# Patient Record
Sex: Female | Born: 1980 | Race: White | Hispanic: No | Marital: Married | Smoking: Former smoker
Health system: Southern US, Community
[De-identification: ages and names within clinical notes are randomized; demographics above are authoritative.]

## PROBLEM LIST (undated history)

## (undated) DIAGNOSIS — R87629 Unspecified abnormal cytological findings in specimens from vagina: Secondary | ICD-10-CM

## (undated) DIAGNOSIS — IMO0002 Reserved for concepts with insufficient information to code with codable children: Secondary | ICD-10-CM

## (undated) DIAGNOSIS — D069 Carcinoma in situ of cervix, unspecified: Secondary | ICD-10-CM

## (undated) DIAGNOSIS — G40909 Epilepsy, unspecified, not intractable, without status epilepticus: Secondary | ICD-10-CM

## (undated) DIAGNOSIS — F419 Anxiety disorder, unspecified: Secondary | ICD-10-CM

## (undated) DIAGNOSIS — R569 Unspecified convulsions: Secondary | ICD-10-CM

## (undated) HISTORY — DX: Unspecified abnormal cytological findings in specimens from vagina: R87.629

## (undated) HISTORY — DX: Reserved for concepts with insufficient information to code with codable children: IMO0002

## (undated) HISTORY — DX: Epilepsy, unspecified, not intractable, without status epilepticus: G40.909

## (undated) HISTORY — PX: CERVICAL CONE BIOPSY: SUR198

## (undated) HISTORY — DX: Anxiety disorder, unspecified: F41.9

## (undated) HISTORY — DX: Carcinoma in situ of cervix, unspecified: D06.9

---

## 1999-06-08 ENCOUNTER — Emergency Department (HOSPITAL_COMMUNITY): Admission: EM | Admit: 1999-06-08 | Discharge: 1999-06-08 | Payer: Self-pay | Admitting: Emergency Medicine

## 2001-11-28 ENCOUNTER — Other Ambulatory Visit: Admission: RE | Admit: 2001-11-28 | Discharge: 2001-11-28 | Payer: Self-pay | Admitting: Family Medicine

## 2005-08-25 ENCOUNTER — Other Ambulatory Visit: Admission: RE | Admit: 2005-08-25 | Discharge: 2005-08-25 | Payer: Self-pay | Admitting: Obstetrics and Gynecology

## 2005-10-23 ENCOUNTER — Encounter (INDEPENDENT_AMBULATORY_CARE_PROVIDER_SITE_OTHER): Payer: Self-pay | Admitting: *Deleted

## 2005-10-24 ENCOUNTER — Ambulatory Visit (HOSPITAL_COMMUNITY): Admission: RE | Admit: 2005-10-24 | Discharge: 2005-10-24 | Payer: Self-pay | Admitting: Obstetrics and Gynecology

## 2007-12-07 ENCOUNTER — Ambulatory Visit (HOSPITAL_COMMUNITY): Admission: RE | Admit: 2007-12-07 | Discharge: 2007-12-07 | Payer: Self-pay | Admitting: Obstetrics and Gynecology

## 2008-02-03 ENCOUNTER — Ambulatory Visit (HOSPITAL_COMMUNITY): Admission: RE | Admit: 2008-02-03 | Discharge: 2008-02-03 | Payer: Self-pay | Admitting: Family Medicine

## 2008-02-09 ENCOUNTER — Encounter: Admission: RE | Admit: 2008-02-09 | Discharge: 2008-02-09 | Payer: Self-pay | Admitting: Family Medicine

## 2008-03-04 ENCOUNTER — Emergency Department (HOSPITAL_COMMUNITY): Admission: EM | Admit: 2008-03-04 | Discharge: 2008-03-04 | Payer: Self-pay | Admitting: Emergency Medicine

## 2008-04-05 ENCOUNTER — Encounter: Admission: RE | Admit: 2008-04-05 | Discharge: 2008-04-05 | Payer: Self-pay | Admitting: Neurology

## 2008-09-16 ENCOUNTER — Ambulatory Visit: Payer: Self-pay | Admitting: Obstetrics and Gynecology

## 2008-09-16 ENCOUNTER — Encounter: Payer: Self-pay | Admitting: Obstetrics and Gynecology

## 2008-09-16 ENCOUNTER — Other Ambulatory Visit: Admission: RE | Admit: 2008-09-16 | Discharge: 2008-09-16 | Payer: Self-pay | Admitting: Obstetrics and Gynecology

## 2008-09-22 ENCOUNTER — Ambulatory Visit: Payer: Self-pay | Admitting: Obstetrics and Gynecology

## 2009-10-02 ENCOUNTER — Other Ambulatory Visit: Admission: RE | Admit: 2009-10-02 | Discharge: 2009-10-02 | Payer: Self-pay | Admitting: Obstetrics and Gynecology

## 2009-10-02 ENCOUNTER — Ambulatory Visit: Payer: Self-pay | Admitting: Obstetrics and Gynecology

## 2010-05-24 ENCOUNTER — Ambulatory Visit: Payer: Self-pay | Admitting: Obstetrics and Gynecology

## 2010-06-14 ENCOUNTER — Other Ambulatory Visit: Payer: Self-pay | Admitting: Obstetrics and Gynecology

## 2010-06-14 ENCOUNTER — Other Ambulatory Visit (HOSPITAL_COMMUNITY)
Admission: RE | Admit: 2010-06-14 | Discharge: 2010-06-14 | Disposition: A | Discharge status: Home / Self Care | Payer: BC Managed Care – PPO | Source: Ambulatory Visit | Attending: Obstetrics and Gynecology | Admitting: Obstetrics and Gynecology

## 2010-06-14 ENCOUNTER — Ambulatory Visit (INDEPENDENT_AMBULATORY_CARE_PROVIDER_SITE_OTHER): Payer: BC Managed Care – PPO | Admitting: Obstetrics and Gynecology

## 2010-06-14 DIAGNOSIS — D069 Carcinoma in situ of cervix, unspecified: Secondary | ICD-10-CM

## 2010-06-14 DIAGNOSIS — R87619 Unspecified abnormal cytological findings in specimens from cervix uteri: Secondary | ICD-10-CM | POA: Insufficient documentation

## 2010-06-22 NOTE — Op Note (Signed)
Shirley Stephenson, Shirley Stephenson NO.:  [id number]   MEDICAL RECORD NO.:  [id number]          PATIENT TYPE:  AMB   LOCATION:  SDC                           FACILITY:  [facility]   PHYSICIAN:  Janine Limbo, M.D.DATE OF BIRTH:  23-Jan-1981   DATE OF PROCEDURE:  12/07/2007  DATE OF DISCHARGE:                               OPERATIVE REPORT   PREOPERATIVE DIAGNOSES:  1. Cervical intraepithelial neoplasia II.  2. Human papillomavirus.   POSTOPERATIVE DIAGNOSIS:  1. Cervical intraepithelial neoplasia II.  2. Human papillomavirus.   PROCEDURE:  Laser ablation of the cervix.   SURGEON:  Janine Limbo, MD   FIRST ASSISTANT:  None.   ANESTHETIC:  General.   DISPOSITION:  Shirley Stephenson is a 30 year old female, para 1-0- 2-1, who  presents with CIN II.  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  She understands the indications for her surgical  procedure as well as the alternative treatment options.  She accepts the  risks of, but not limited to, anesthetic complications, bleeding,  infections, and possible damage to the surrounding organs.  The patient  wishes to maintain her childbearing potential and is concerned about  cervical weakening if she proceeds with a repeat conization of the  cervix.   FINDINGS:  The patient was noted to have white epithelium to the left  and superior of the cervical os.  There was also an area of white  epithelium at the 6 o'clock position at the transition zone.  The  transition zone was completely seen.  The patient tolerated her  procedure well.   PROCEDURE:  The patient was taken to the operating room where a general  anesthetic was given.  The patient's perineum and vagina were prepped  with multiple layers of Betadine.  The bladder was drained of urine.  Examination under anesthesia was performed.  The patient was then draped  in sterile  wet towels.  The laser was appropriately tested and was noted  to be functioning correctly.  Colposcopy was performed.  The patient's  cervix was coated with acetic acid.  White epithelial lesions were noted  as mentioned above.  We then used a focused beam of the laser to outline  margins for ablation.  We used a defocused beam of the laser set at 20  watts to ablate the cervix removing all white epithelial lesions.  A 0.5  cm border was then ablated using a defocused beam of the laser.  The  endocervix was then sounded using a Betadine-soaked uterine sound.  The  endocervix was noted to be patent.  Hemostasis was adequate.  We were  ready to terminate our procedure.  Sponge, needle, and instrument counts  were correct.  Estimated blood loss was zero.  The patient tolerated her  procedure well.  She was awakened from her anesthetic without difficulty  and then taken to the recovery room in stable condition.   FOLLOWUP INSTRUCTIONS:  The patient was given a prescription for Motrin  and she  will take 800 mg every 8 hours as needed for mild-to-moderate  pain.  She was given prescription for Vicodin and she will take 1 or 2  tablets every 4 hours as needed for severe pain.  She was given a  prescription for Zofran and she will take 4 mg every 4-6 hours as needed  for nausea.  She was given a copy of the postoperative instruction sheet  as prepared by the [facility] for patients who have  undergone a dilatation and curettage with them modified for laser  procedure.  She will return to see Dr. Stefano Gaul in 2-3 weeks for  followup examination.  She will refrain from intercourse until all  discharge and bleeding has resolved.      Janine Limbo, M.D.  Electronically Signed     AVS/MEDQ  D:  12/07/2007  T:  12/07/2007  Job:  [id number]   cc:   Talmadge Coventry, M.D.  Fax: [phone]

## 2010-06-22 NOTE — H&P (Signed)
Shirley Stephenson, Shirley Stephenson NO.:  [id number]   MEDICAL RECORD NO.:  [id number]          PATIENT TYPE:  AMB   LOCATION:  SDC                           [facility]  [facility]   PHYSICIAN:  Janine Limbo, M.D.DATE OF BIRTH:  02-Jun-1980   DATE OF ADMISSION:  DATE OF DISCHARGE:                              HISTORY & PHYSICAL   HISTORY OF PRESENT ILLNESS:  Shirley Stephenson is a 30 year old female, para 1-  0-2-1, who presents for laser ablation of the cervix.  The patient has  been followed at the [facility]  [facility].  The patient had a cold knife  conization in 2007 because of cervical intraepithelial neoplasia III.  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  Her Pap  smear in September 2009 showed a high-grade lesion.  Colposcopically  directed biopsies were performed and the patient was found to have  cervical intraepithelial neoplasia II on the exocervix.  The  endocervical curettage was negative.   OBSTETRICAL HISTORY:  The patient has had 1 term vaginal delivery and 2  elective pregnancy terminations.   DRUG ALLERGIES:  NO KNOWN DRUG ALLERGIES.   PAST MEDICAL HISTORY:  1. The patient has a history of postpartum depression.  2. She also has a history of anxiety, chronic depression, and      anorexia.  3. She has had her wisdom teeth removed.  4. She had a cold knife conization of the cervix in 2007.   SOCIAL HISTORY:  The patient smokes cigarettes socially.  She drinks  alcohol socially.  She denies other recreational drug uses.   REVIEW OF SYSTEMS:  Noncontributory.   FAMILY HISTORY:  Noncontributory.   PHYSICAL EXAMINATION:  VITAL SIGNS:  Weight is 154 pounds.  HEENT:  Within normal limits.  CHEST:  Clear.  HEART:  Regular rate and rhythm.  BREASTS:  Without masses.  ABDOMEN:  Nontender.  EXTREMITIES:  Grossly normal.  NEUROLOGIC:  Grossly normal.  PELVIC:  External genitalia  is normal.  Vagina is normal.  Cervix is  nontender.  Uterus is normal size, shape and consistency.  Adnexa, no  masses.   ASSESSMENT:  Recurrent cervical intraepithelial neoplasia II.   PLAN:  A long discussion was held with the patient and her significant  other about her treatment options.  The patient wishes to maintain her  childbearing potential at this time.  She is concerned about the risk of  preterm labor  and/or cervical weakening associated with a repeat conization of the  cervix.  She has elected to proceed with laser ablation of the cervix.  She understands the indications for her surgical procedure.  She accepts  the risks of, but not limited to, anesthetic complications, bleeding,  infections, and possible damage to the surrounding organs.      Janine Limbo, M.D.  Electronically Signed     AVS/MEDQ  D:  12/05/2007  T:  12/05/2007  Job:  [id number]   cc:   Talmadge Coventry, M.D.  Fax: [phone]

## 2010-06-25 NOTE — Op Note (Signed)
Shirley Stephenson, Shirley Stephenson NO.:  [id number]   MEDICAL RECORD NO.:  [id number]          PATIENT TYPE:  AMB   LOCATION:  SDC                           [facility]   PHYSICIAN:  Janine Limbo, M.D.DATE OF BIRTH:  July 30, 1980   DATE OF PROCEDURE:  10/24/2005  DATE OF DISCHARGE:                                 OPERATIVE REPORT   PREOPERATIVE DIAGNOSIS:  Cervical intraepithelial neoplasia III.   POSTOPERATIVE DIAGNOSIS:  Cervical intraepithelial neoplasia III.   PROCEDURE:  Cold knife conization.   SURGEON:  Leonard Schwartz, M.D.   ANESTHETIC:  General.   DISPOSITION:  Ms. Mcnelly is a 30 year old female, para 1-0-2-1, who  presents with the above-mentioned diagnosis.  She understands the  indications for her surgical procedure and she accepts the risks of, but not  limited to, anesthetic complications, bleeding, infections, and possible  damage to the surrounding organs.  She understands that there is a small  increased risk of preterm labor in future pregnancies.   FINDINGS:  There was a non-staining area at the 12 o'clock position on the  cervix.  No other abnormalities were noted.   PROCEDURE:  The patient was taken to the operating room, where a general  anesthetic was given.  The patient's abdomen, perineum, and vagina were  prepped with multiple layers of Betadine.  The bladder was drained of urine.  The patient was sterilely draped.  Examination under anesthesia was  performed.  A paracervical block was placed using 10 mL of 0.5% Marcaine  with epinephrine.  An additional 15 mL of 0.5% Marcaine with epinephrine  were injected directly into the cervix.  The cervix was coated we had  Lugol's solution.  A non-staining area was noted at the 12 o'clock position.  The endocervix was sounded.  A circumferential incision was made around the  cervix, removing all of the non-staining areas.  The incision was then  extended in a cone-like fashion toward  the endocervix.  The specimen was  removed.  The specimen had already been opened at the 2 o'clock position.  The specimen was sent to pathology for evaluation.  The Bovie cautery was  used to cauterize the defect in the cervix and also to cauterize the  periphery of the conization specimen.  Stitches had previously been placed  at the 3 o'clock position and the 9 o'clock position on the cervix.  We then  placed 4 inverting sutures into the cervix.  Hemostasis was adequate.  All  instruments were removed.  The examination was repeated and the uterus was  noted to be firm.  The patient was then returned to the supine position.  The anesthetic was reversed.  The patient was taken to the recovery room in  stable condition.  Sponge, needle, and instrument counts were correct on 2  occasions.  The estimated blood loss for the procedure was 10 mL.  The  patient tolerated her procedure well.  A 0 Vicryl was the suture material  used throughout the procedure, except where otherwise mentioned.   FOLLOWUP INSTRUCTIONS:  The patient will return to see Dr.  Stringer in 2-3  weeks for a followup examination.  She was given a copy of the postoperative  instruction sheet as prepared by the [facility]  [facility] for patients who have undergone a dilatation and curettage, but  then was modified for a conization of the cervix.  The patient will take  ibuprofen 800 mg every 8 hours as needed for mild to moderate pain.  She  will take Vicodin one or two tablets every 4 hours as needed for severe  pain.  She will call for questions or concerns.      Janine Limbo, M.D.  Electronically Signed     AVS/MEDQ  D:  10/24/2005  T:  10/25/2005  Job:  [id number]   cc:   Talmadge Coventry, M.D.  Fax: [phone]

## 2010-06-25 NOTE — H&P (Signed)
Shirley Stephenson, Shirley Stephenson NO.:  [id number]   MEDICAL RECORD NO.:  [id number]          PATIENT TYPE:  AMB   LOCATION:  SDC                           [facility]   PHYSICIAN:  Janine Limbo, M.D.DATE OF BIRTH:  Jun 01, 1980   DATE OF ADMISSION:  10/27/2005  DATE OF DISCHARGE:                                HISTORY & PHYSICAL   HISTORY OF PRESENT ILLNESS:  Ms. Carico is a 30 year old female, para 1-0-2-  1, who presents for a cold knife conization of the cervix.  The patient had  colposcopically directed biopsies that showed a high grade squamous  intraepithelial lesion and cells consistent with cervical intraepithelial  neoplasia III.  She also has a history of the human papilloma virus.   OBSTETRICAL HISTORY:  The patient has had one term vaginal delivery and two  elective pregnancy terminations.   ALLERGIES:  No known drug allergies.   PAST MEDICAL HISTORY:  The patient has a history of postpartum depression.  She also has a history of anxiety, chronic depression, and anorexia.  She  has had her wisdom teeth removed.   SOCIAL HISTORY:  The patient smokes 5-10 cigarettes each day.  She drinks  alcohol socially.  She denies other recreational drug use.   REVIEW OF SYSTEMS:  Noncontributory.   FAMILY HISTORY:  Noncontributory.   PHYSICAL EXAMINATION:  VITAL SIGNS:  Weight 141 pounds.  HEENT:  Within normal limits.  CHEST:  Clear.  HEART:  Regular rate and rhythm.  BREASTS:  Without masses.  ABDOMEN:  Nontender.  EXTREMITIES:  Grossly normal.  NEUROLOGICAL:  Grossly normal.  PELVIC EXAM:  External genitalia shows a few hyperpigmented lesions.  Vagina  is within normal limits.  The cervix is  nontender.  The uterus is normal  size, shape and consistency.  Adnexa no masses.   ASSESSMENT:  Cervical intraepithelial neoplasia III.   PLAN:  The patient will undergo a cold knife conization of the cervix.  She  understands the indications for her surgical  procedure and she accepts the  risks of, but not limited to, anesthetic complications, bleeding, infection,  and possible damage to surrounding organs.      Janine Limbo, M.D.  Electronically Signed     AVS/MEDQ  D:  10/21/2005  T:  10/21/2005  Job:  [id number]   cc:   Talmadge Coventry, M.D.  Fax: [phone]

## 2010-09-10 ENCOUNTER — Encounter: Payer: Self-pay | Admitting: Obstetrics and Gynecology

## 2010-09-27 ENCOUNTER — Encounter: Payer: Self-pay | Admitting: Gynecology

## 2010-09-27 ENCOUNTER — Telehealth: Payer: Self-pay | Admitting: *Deleted

## 2010-09-27 ENCOUNTER — Ambulatory Visit (INDEPENDENT_AMBULATORY_CARE_PROVIDER_SITE_OTHER): Payer: BC Managed Care – PPO | Admitting: Gynecology

## 2010-09-27 DIAGNOSIS — N63 Unspecified lump in unspecified breast: Secondary | ICD-10-CM

## 2010-09-27 NOTE — Telephone Encounter (Signed)
[facility] CALLED STATING THAT THE PATIENT NEEDS TO HAVE BILATERAL MAMMO. ALSO WITH THE ULTRASOUND YOU ORDERED . PLEASE ADVISE.

## 2010-09-27 NOTE — Telephone Encounter (Signed)
PT APPOINTMENT SET FOR 10/04/10 AT 1:50PM AT [facility].

## 2010-09-27 NOTE — Progress Notes (Signed)
Patient presents complaining of right breast mass for approximately one month. She never noticed this before. It's nontender hasn't changed since she first noticed it. She has no strong family history of breast cancer.  Exam Breasts: Examined lying and sitting Left without masses retractions discharge adenopathy Right palpable firm mobile mass approximately 1.5 cm 12:00 position off the breast anterior chest wall nontender no overlying skin changes. Breast tissue itself is without masses retractions discharge or adenopathy.  Assessment and plan: Right chest wall mass actually feels above her breast tissue proper. We'll start with ultrasound scheduled at the [facility] Center and go from there. Differential was discussed with the patient to include sebaceous cyst, fibroadenoma, simple cyst and tumor.  Various scenarios reviewed to include if benign on ultrasound monitor and recheck in 6 months assuming stable, referral to general surgery regardless for excision and possible needle sampling.  Patient knows we will schedule the appointment if she does not hear from my office within several days to call me to make sure that we do so.

## 2010-09-27 NOTE — Telephone Encounter (Signed)
ok 

## 2010-10-03 ENCOUNTER — Other Ambulatory Visit: Payer: Self-pay | Admitting: Obstetrics and Gynecology

## 2010-10-04 ENCOUNTER — Ambulatory Visit
Admission: RE | Admit: 2010-10-04 | Discharge: 2010-10-04 | Disposition: A | Discharge status: Home / Self Care | Payer: BC Managed Care – PPO | Source: Ambulatory Visit | Attending: Gynecology | Admitting: Gynecology

## 2010-10-04 DIAGNOSIS — N63 Unspecified lump in unspecified breast: Secondary | ICD-10-CM

## 2010-10-06 ENCOUNTER — Encounter: Payer: Self-pay | Admitting: Gynecology

## 2010-10-06 ENCOUNTER — Ambulatory Visit (INDEPENDENT_AMBULATORY_CARE_PROVIDER_SITE_OTHER): Payer: BC Managed Care – PPO | Admitting: Gynecology

## 2010-10-06 ENCOUNTER — Ambulatory Visit: Payer: BC Managed Care – PPO | Admitting: Gynecology

## 2010-10-06 DIAGNOSIS — N63 Unspecified lump in unspecified breast: Secondary | ICD-10-CM

## 2010-10-06 NOTE — Progress Notes (Signed)
Patient presents in followup of her right breast mass. She had a mammogram and ultrasound read by Dr. Deboraha Sprang and the impression was probable benign fibroadenoma suggest short interval follow up ultrasound 6, 12 and 24 months. I asked patient to come back in to me reexamine her and then to make sure she is comfortable with this recommendation.  Exam Right breast examined lying and sitting with firm oval mass approximately 1.5 cm 12:00 position at the periphery of the breast. Mobile no overlying skin changes no other masses discharge or axillary adenopathy.  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 I discussed with the patient the issues that diagnosis of probable benign means also possible malignant and she needs to feel comfortable with expectant management particularly if a malignancy is discovered in the future. Options to include needle biopsy as well as excisional biopsy was discussed. Patient's comfortable with observation she understands next sepsis the disclaimer of malignancy and she'll followup for recheck in 6 months per Dr. Teodoro Kil recommendation

## 2010-11-08 LAB — URINALYSIS, ROUTINE W REFLEX MICROSCOPIC
Ketones, ur: 15 — AB
Nitrite: NEGATIVE
Protein, ur: NEGATIVE
Urobilinogen, UA: 0.2
pH: 5

## 2010-11-08 LAB — CBC
Platelets: 280
RDW: 12.3

## 2010-11-08 LAB — HCG, SERUM, QUALITATIVE: Preg, Serum: NEGATIVE

## 2010-11-08 LAB — PREGNANCY, URINE: Preg Test, Ur: NEGATIVE

## 2010-12-22 ENCOUNTER — Other Ambulatory Visit: Payer: Self-pay | Admitting: Obstetrics and Gynecology

## 2011-01-03 ENCOUNTER — Other Ambulatory Visit (HOSPITAL_COMMUNITY)
Admission: RE | Admit: 2011-01-03 | Discharge: 2011-01-03 | Disposition: A | Discharge status: Home / Self Care | Payer: BC Managed Care – PPO | Source: Ambulatory Visit | Attending: Obstetrics and Gynecology | Admitting: Obstetrics and Gynecology

## 2011-01-03 ENCOUNTER — Ambulatory Visit (INDEPENDENT_AMBULATORY_CARE_PROVIDER_SITE_OTHER): Payer: BC Managed Care – PPO | Admitting: Obstetrics and Gynecology

## 2011-01-03 DIAGNOSIS — B373 Candidiasis of vulva and vagina: Secondary | ICD-10-CM

## 2011-01-03 DIAGNOSIS — D069 Carcinoma in situ of cervix, unspecified: Secondary | ICD-10-CM

## 2011-01-03 DIAGNOSIS — N841 Polyp of cervix uteri: Secondary | ICD-10-CM

## 2011-01-03 DIAGNOSIS — Z01419 Encounter for gynecological examination (general) (routine) without abnormal findings: Secondary | ICD-10-CM | POA: Insufficient documentation

## 2011-01-03 DIAGNOSIS — N938 Other specified abnormal uterine and vaginal bleeding: Secondary | ICD-10-CM

## 2011-01-03 DIAGNOSIS — N899 Noninflammatory disorder of vagina, unspecified: Secondary | ICD-10-CM

## 2011-01-03 DIAGNOSIS — N898 Other specified noninflammatory disorders of vagina: Secondary | ICD-10-CM

## 2011-01-03 DIAGNOSIS — N949 Unspecified condition associated with female genital organs and menstrual cycle: Secondary | ICD-10-CM

## 2011-01-03 NOTE — Progress Notes (Signed)
Patient came to see me today for problem visit. Shortly after starting intercourse 2 weeks ago she noticed some vaginal bleeding. They stopped at that point. She has not had intercourse since. She is having no itching or vaginal discharge. She remains on continuous birth control pills such as not have a period. She is very worried that she has a malignancy because of her CIN-3 with a previous conization.  Pelvic exam: Kennon Portela present.  External: Within normal limits BUS: Within normal limits. Vagina: Within normal limits. Cervix: Clean but small growth at external os. Not sure if it's just scarring from conization or small polyp. Uterus: Normal size and shape. Adnexa: Within normal limits.wet prep positive for yeast.  Assessment: Post coital bleeding-one episode. Yeast vaginitis. Possible cervical polyp. CIN-3.  Plan: Pap and endometrial biopsy done. Nurse will call patient. Diflucan 100 mg daily for 4 days. Small growth from the cervix removed. Discussed saline infusion histogram if above persists.

## 2011-01-04 MED ORDER — FLUCONAZOLE 100 MG PO TABS: 100.0000 mg | ORAL_TABLET | Freq: Every day | ORAL | Status: AC

## 2011-01-04 NOTE — Progress Notes (Signed)
Addended by: Venora Maples on: 01/04/2011 04:00 PM   Modules accepted: Orders

## 2011-01-04 NOTE — Progress Notes (Signed)
PT. CALLED BACK  AND STATES HER RX WAS NOT AT [facility] WHEN SHE WENT TO PICK IT UP SO I CALLED GENERIC DIFLUCAN RX TO [facility].

## 2011-03-31 ENCOUNTER — Ambulatory Visit: Payer: BC Managed Care – PPO | Admitting: Internal Medicine

## 2011-06-05 ENCOUNTER — Other Ambulatory Visit: Payer: Self-pay | Admitting: Obstetrics and Gynecology

## 2011-06-11 ENCOUNTER — Other Ambulatory Visit: Payer: Self-pay | Admitting: Obstetrics and Gynecology

## 2011-06-12 ENCOUNTER — Telehealth: Payer: Self-pay | Admitting: Gynecology

## 2011-06-12 MED ORDER — LEVONORGESTREL-ETHINYL ESTRAD 0.1-20 MG-MCG PO TABS: 1.0000 | ORAL_TABLET | Freq: Every day | ORAL | Status: DC

## 2011-06-12 NOTE — Telephone Encounter (Signed)
Needs refill on BCPs.  Orsythia X 6 ordered.

## 2011-09-12 DIAGNOSIS — L658 Other specified nonscarring hair loss: Secondary | ICD-10-CM | POA: Insufficient documentation

## 2011-12-12 ENCOUNTER — Other Ambulatory Visit: Payer: Self-pay | Admitting: Gynecology

## 2012-01-02 ENCOUNTER — Ambulatory Visit (INDEPENDENT_AMBULATORY_CARE_PROVIDER_SITE_OTHER): Payer: 59 | Admitting: Gynecology

## 2012-01-02 ENCOUNTER — Encounter: Payer: Self-pay | Admitting: Gynecology

## 2012-01-02 DIAGNOSIS — N898 Other specified noninflammatory disorders of vagina: Secondary | ICD-10-CM

## 2012-01-02 DIAGNOSIS — N9089 Other specified noninflammatory disorders of vulva and perineum: Secondary | ICD-10-CM

## 2012-01-02 DIAGNOSIS — N889 Noninflammatory disorder of cervix uteri, unspecified: Secondary | ICD-10-CM

## 2012-01-02 DIAGNOSIS — N930 Postcoital and contact bleeding: Secondary | ICD-10-CM

## 2012-01-02 LAB — WET PREP FOR TRICH, YEAST, CLUE: Trich, Wet Prep: NONE SEEN

## 2012-01-02 MED ORDER — FLUCONAZOLE 200 MG PO TABS: 200.0000 mg | ORAL_TABLET | Freq: Every day | ORAL | Status: DC

## 2012-01-02 MED ORDER — METRONIDAZOLE 500 MG PO TABS: 500.0000 mg | ORAL_TABLET | Freq: Two times a day (BID) | ORAL | Status: DC

## 2012-01-02 NOTE — Progress Notes (Signed)
Patient presents with several issues.  The first is postcoital spotting or bleeding. She notes with almost every coital episode some bleeding anywhere from light spotting to a menstrual type flow. She is on oral contraceptives initially taking continuously but now intermittently with monthly withdrawals. No pain or other symptoms. Also having vulvar "skin tags" that she wants me to look at. Apparently these had been biopsied previously by Dr. Stefano Gaul. She does have a history of significant cervical disc plasia with cone biopsy x2 in the 2006-2008 timeframe.  Her Pap smears have been normal since then. She's also being followed for epilepsy.  Exam with Fleet Contras assistant External with multiple pigmented raised wartlike lesions flat to more pedunculated. Somewhat darker pigmentation. Covering from mons through both labia majora to the perineal body and out onto the thigh region. The rest vagina normal with dark staining. Cervix with scarring consistent with cone biopsy. Gross endocervical tufting questionable polyp noted.  Uterus normal size midline mobile nontender. Adnexa without masses or tenderness.  Assessment and plan: 1. 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 Some with dark pigmentation. Recommended sampling biopsies of several areas rule out significant vulvar dysplasia. Given the number impractical to excise all of them. Options to include sampling the worst looking been treating with TCA which would be several treatment cycles. Alternatives such as laser also discussed. 2. Post coital bleeding. Wet prep consistent with yeast and BV. Treat with Diflucan 200 every other day x2 doses and Flagyl 500 mg twice a day x7 days, alcohol avoidance reviewed. GC Chlamydia screen done. Recommend start with ultrasound probable sonohysterogram rule out intracavitary abnormalities. 3. Health maintenance/endocervical tufting. Feel the cervical changes  probably scarring effect from her Crohn's. Did recommend colposcopy to take a closer look. She's overdue for Pap smear we can do this at the same time if this does appear to be tufting grossly. If there is any question of dysplasia then we'll biopsy. She is also overdue for annual we can do both of these at the same time.  Discussed timing issue with these 3 separate procedures and issues. I would recommend starting with the health maintenance/colposcopy first. We can then schedule the sonohysterogram and then lastly the vulvar biopsies and treatment plan. Patient understands my concern is for see vulvar lesions and the need for follow up.  Lastly I did see her last year with a questionable breast lump that she had mammogram ultrasound and the right upper inner quadrant and they thought it was a probable benign fibroadenoma. They recommended short-term follow up studies which she never did saying that on self-exam this area resolved and we'll reexamine her at her annual appointment and then decide if further testing needed.

## 2012-01-02 NOTE — Patient Instructions (Signed)
Follow up for annual exam and colposcopy appointments. We will then subsequently schedule the uterine ultrasound appointment for the postcoital bleeding and the vulvar biopsy and treatment of the external tags.

## 2012-01-03 ENCOUNTER — Telehealth: Payer: Self-pay | Admitting: *Deleted

## 2012-01-03 NOTE — Telephone Encounter (Signed)
Pt called and stated that she took the Diflucan but will wait til after this weekend to take the Flagyl because of alcohol consumption. Advised the pt to take it as soon as she can. KW

## 2012-01-16 ENCOUNTER — Ambulatory Visit (INDEPENDENT_AMBULATORY_CARE_PROVIDER_SITE_OTHER): Payer: 59 | Admitting: Gynecology

## 2012-01-16 ENCOUNTER — Encounter: Payer: Self-pay | Admitting: Gynecology

## 2012-01-16 VITALS — BP 114/68 | Ht 68.75 in | Wt 151.0 lb

## 2012-01-16 DIAGNOSIS — N930 Postcoital and contact bleeding: Secondary | ICD-10-CM

## 2012-01-16 DIAGNOSIS — Z131 Encounter for screening for diabetes mellitus: Secondary | ICD-10-CM

## 2012-01-16 DIAGNOSIS — Z01419 Encounter for gynecological examination (general) (routine) without abnormal findings: Secondary | ICD-10-CM

## 2012-01-16 DIAGNOSIS — Z13220 Encounter for screening for lipoid disorders: Secondary | ICD-10-CM

## 2012-01-16 DIAGNOSIS — N9089 Other specified noninflammatory disorders of vulva and perineum: Secondary | ICD-10-CM

## 2012-01-16 LAB — CBC WITH DIFFERENTIAL/PLATELET
Basophils Relative: 1 % (ref 0–1)
Eosinophils Absolute: 0.1 10*3/uL (ref 0.0–0.7)
HCT: 44.7 % (ref 36.0–46.0)
Hemoglobin: 14.6 g/dL (ref 12.0–15.0)
Lymphs Abs: 1.3 10*3/uL (ref 0.7–4.0)
MCH: 30.7 pg (ref 26.0–34.0)
MCHC: 32.7 g/dL (ref 30.0–36.0)
Monocytes Absolute: 0.5 10*3/uL (ref 0.1–1.0)
Monocytes Relative: 11 % (ref 3–12)

## 2012-01-16 LAB — LIPID PANEL
Cholesterol: 131 mg/dL (ref 0–200)
HDL: 47 mg/dL (ref >39)
Total CHOL/HDL Ratio: 2.8 Ratio
VLDL: 10 mg/dL (ref 0–40)

## 2012-01-16 MED ORDER — LEVONORGESTREL-ETHINYL ESTRAD 0.1-20 MG-MCG PO TABS: 1.0000 | ORAL_TABLET | Freq: Every day | ORAL | Status: DC

## 2012-01-16 NOTE — Progress Notes (Signed)
Shirley Stephenson 1980-08-19 [id number]        31 y.o.  G3P1021 for annual exam.  Several issues noted below  Past medical history,surgical history, medications, allergies, family history and social history were all reviewed and documented in the EPIC chart. ROS:  Was performed and pertinent positives and negatives are included in the history.  Exam: Shirley Stephenson assistant Filed Vitals:   01/16/12 1034  BP: 114/68  Height: 5' 8.75" (1.746 m)  Weight: 151 lb (68.493 kg)   General appearance  Normal Skin grossly normal Head/Neck normal with no cervical or supraclavicular adenopathy thyroid normal Lungs  clear Cardiac RR, without RMG Abdominal  soft, nontender, without masses, organomegaly or hernia Breasts  examined lying and sitting without masses, retractions, discharge or axillary adenopathy. Pelvic  Ext/BUS/vagina  With multiple raised pigmented lesions over the mons and bilateral labia majora and perineal body  Cervix  Somewhat scarred with tufting  Uterus  anteverted, normal size, shape and contour, midline and mobile nontender   Adnexa  Without masses or tenderness    Anus and perineum  normal   Rectovaginal  normal sphincter tone without palpated masses or tenderness.    Assessment/Plan:  31 y.o. [id number] female for annual exam.   1. Vulvar lesions. Consistent with HPV effect. Several darkly pigmented. Recommend sampling if not excision rule out significant VIN. Patient will make a separate appointment and knows importance of follow up.  Areas treatment strategies to include TCA versus laser reviewed. Too many lesions to consider excision for all of them impractical. 2. Cervical tufting. Probably due to her history of cervical cone x2. She has an appointment tomorrow for colposcopy and will do a Pap smear at that time and sample any areas that look in usual. 3. Postcoital bleeding. Patient completed her treatment of Flagyl. If bleeding continues discussed sonohysterogram to rule out  upper polyps but at this point if it clears him we'll follow. 4. Contraception. Patient uses low-dose birth control pills continuously at the recommendation of her neurologist who follows her for her epilepsy. I refilled her pills x1 year. Risks of stroke heart attack DVT associated with oral contraception discussed. She does not smoke is not being followed for any medical issues other than her epilepsy. 5. Breast health. SBE monthly reviewed.  History of right breast mass 12:00 periphery of the breast with mammogram ultrasound suggestive of fibroadenoma. Patient is on self-exam this mass has resolved on my exam it is also gone. We'll continue with SBE monthly. As long as her no palpable abnormalities will plan to follow up routine mammography at age 47. 56. Health maintenance. Baseline CBC lipid profile glucose urinalysis ordered.  Follow up for studies as noted above.    Dara Lords MD, 11:44 AM 01/16/2012

## 2012-01-16 NOTE — Patient Instructions (Signed)
Follow up for colposcopy appt tomorrow

## 2012-01-17 ENCOUNTER — Encounter: Payer: Self-pay | Admitting: Gynecology

## 2012-01-17 ENCOUNTER — Ambulatory Visit (INDEPENDENT_AMBULATORY_CARE_PROVIDER_SITE_OTHER): Payer: 59 | Admitting: Gynecology

## 2012-01-17 ENCOUNTER — Other Ambulatory Visit (HOSPITAL_COMMUNITY)
Admission: RE | Admit: 2012-01-17 | Discharge: 2012-01-17 | Disposition: A | Discharge status: Home / Self Care | Payer: 59 | Source: Ambulatory Visit | Attending: Gynecology | Admitting: Gynecology

## 2012-01-17 DIAGNOSIS — Z01419 Encounter for gynecological examination (general) (routine) without abnormal findings: Secondary | ICD-10-CM | POA: Insufficient documentation

## 2012-01-17 DIAGNOSIS — N841 Polyp of cervix uteri: Secondary | ICD-10-CM

## 2012-01-17 DIAGNOSIS — Z1151 Encounter for screening for human papillomavirus (HPV): Secondary | ICD-10-CM | POA: Insufficient documentation

## 2012-01-17 DIAGNOSIS — Z124 Encounter for screening for malignant neoplasm of cervix: Secondary | ICD-10-CM

## 2012-01-17 LAB — URINALYSIS W MICROSCOPIC + REFLEX CULTURE
Bacteria, UA: NONE SEEN
Bilirubin Urine: NEGATIVE
Hgb urine dipstick: NEGATIVE
Protein, ur: NEGATIVE mg/dL
Squamous Epithelial / LPF: NONE SEEN
Urobilinogen, UA: 0.2 mg/dL (ref 0.0–1.0)

## 2012-01-17 NOTE — Progress Notes (Addendum)
Patient ID: Shirley Stephenson, female   DOB: December 10, 1980, 31 y.o.   MRN: [id number] Patient presents for colposcopy. She has a history of cervical cone biopsy x2 for high-grade dysplasia and on physical exam has a scarred cervix with what appears to be in the cervical tufting and I wanted to take a closer look colposcopically.  Exam with Sherrilyn Rist assistant Colposcopy after Pap smear taken and subsequent acetic acid cleanse is adequate with small endocervical polyp versus coughing that was biopsied off. Small area of acetowhite subtle change at 12:00 which also was biopsied.   Physical Exam  Genitourinary:    Assessment and plan: History of significant cervical dysplasia status post cone biopsy x2. Small area of endocervical tufting versus polyp. This was biopsied off. Small area of subtle acetowhite change at 12:00 also biopsied. Pap smear done prior to see Gussie cleansing. We'll follow for all these results and then triage based on these results. Patient will make an appointment to see me for her vulvar biopsies.

## 2012-01-17 NOTE — Patient Instructions (Signed)
Follow up for biopsy results Follow up for vulvar biopsies

## 2012-01-17 NOTE — Addendum Note (Signed)
Addended by: Richardson Chiquito on: 01/17/2012 05:08 PM   Modules accepted: Orders

## 2012-02-07 ENCOUNTER — Telehealth: Payer: Self-pay | Admitting: Gynecology

## 2012-02-07 NOTE — Telephone Encounter (Signed)
I had asked the pathology Department to review her cervical biopsy the showed dysplasia but they were unable to graded. Her Pap smear was normal and her HPV was negative. Dr. Adolphus Birchwood pathologist called and said that he looked at it with Dr. Dierdre Searles and felt it did show dysplasia but they were going to run a P16 stain and will call back with these results. If the stain is negative and tentatively will plan on expectant management with repeat evaluation in 6 months. If positive then we'll pursue a more aggressive evaluation.

## 2012-02-09 ENCOUNTER — Telehealth: Payer: Self-pay | Admitting: Gynecology

## 2012-02-09 NOTE — Telephone Encounter (Signed)
Dr. Dierdre Searles from pathology called. They did AP 16 staining which was faintly positive. This implies that is not high risk HPV associated. She reviewed the biopsy and felt that some of the dysplasia may be metaplasia and feels comfortable with expectant management. She also reviewed the Pap smear and repeated the HPV screen both of which were negative. We'll plan repeat Pap smear HPV in 6 months we'll plan on following her expectantly at this time.  Patient does have an appointment tomorrow for vulvar biopsies and follow up of her pigmented condylomatous-appearing lesions.

## 2012-02-10 ENCOUNTER — Ambulatory Visit (INDEPENDENT_AMBULATORY_CARE_PROVIDER_SITE_OTHER): Payer: 59 | Admitting: Gynecology

## 2012-02-10 ENCOUNTER — Ambulatory Visit: Payer: 59 | Admitting: Gynecology

## 2012-02-10 ENCOUNTER — Encounter: Payer: Self-pay | Admitting: Gynecology

## 2012-02-10 DIAGNOSIS — N9089 Other specified noninflammatory disorders of vulva and perineum: Secondary | ICD-10-CM

## 2012-02-10 NOTE — Progress Notes (Signed)
Patient ID: Shirley Stephenson, female   DOB: [birth year], 32 y.o.   MRN: [id number] The patient presents for vulvar biopsy. She has a history of high-grade dysplasia status post cone biopsy x2. Recently had colposcopy due to questionable endocervical polyp which on pathology returned consistent with endocervical mucosal tufting but she also had a small acetowhite change that on biopsy showed dysplasia. She has a number of pigmented raised vulvar lesions which some are getting bigger and have asked her come back to we can be sampled the worst looking lesions as she does have a number of these on her vulva and mons pubis.  Exam with kim assistant Multiple raised papular pigmented areas. 7 areas excised as outlined below. All areas were cleansed with Betadine, subcutaneously injected with 1% lidocaine and the lesions were excised at the level of the surrounding skin. Lesions 1 through 4 had silver nitrate applied afterwards for hemostasis. Lesions 5-7 had 4-0 Vicryl interrupted cutaneous stitches for reapproximation and hemostasis. All lesions were sent in separate specimen cups and labeled.   Physical Exam  Genitourinary:     Assessment and plan: 1. I reviewed with patient her recent cervical biopsy did show dysplasia which was difficult to grade per discussion with Dr. Dierdre Searles and p16 staining was weakly positive which Dr. Dierdre Searles said is considered a negative finding more consistent with a low-grade change. At this point I would recommend recolposcopy in 6 months and patient agrees with this. 2. Multiple vulvar lesions. She still has several vulvar and 2 mons pubis lesions.  Due to the number of biopsies today I did not want to take them all off but we will plan a staged procedure and she will follow up for the pathology report and assuming appropriate then we'll reassess in 4-8 weeks and excised any remaining areas. Patient knows that we will call her in several days with pathology results and if she does not hear  from [place] to call.  Postoperative instructions to include sitz baths and Advil reviewed.

## 2012-02-10 NOTE — Patient Instructions (Signed)
Use sitz baths and hairdryer afterwords for comfort. Take Advil as needed. Call if you have any questions. Office will call you with biopsy results in several days. Tentatively plan on colposcopy in 6 months to relook at the cervix. And follow up in one to 2 months to completely excise all remaining vulvar lesions.

## 2012-02-10 NOTE — Addendum Note (Signed)
Addended by: Dara Lords on: 02/10/2012 11:33 AM   Modules accepted: Orders

## 2012-03-19 ENCOUNTER — Ambulatory Visit (INDEPENDENT_AMBULATORY_CARE_PROVIDER_SITE_OTHER): Payer: 59 | Admitting: Gynecology

## 2012-03-19 ENCOUNTER — Encounter: Payer: Self-pay | Admitting: Gynecology

## 2012-03-19 DIAGNOSIS — N879 Dysplasia of cervix uteri, unspecified: Secondary | ICD-10-CM

## 2012-03-19 DIAGNOSIS — N9089 Other specified noninflammatory disorders of vulva and perineum: Secondary | ICD-10-CM

## 2012-03-19 NOTE — Patient Instructions (Signed)
Office will call you with biopsy results. Follow up for colposcopy and Pap smear in June/July 2014

## 2012-03-19 NOTE — Addendum Note (Signed)
Addended by: Dara Lords on: 03/19/2012 12:11 PM   Modules accepted: Orders

## 2012-03-19 NOTE — Progress Notes (Signed)
Patient ID: Shirley Stephenson, female   DOB: December 19, 1980, 32 y.o.   MRN: [id number] Patient presents for vulvar biopsies. History of multiple pigmented lesions on her mons pubis and vulvar region. Underwent biopsy of 7 lesions previously all of which returned seborrheic keratoses. No dysplasia. She is here to complete the biopsies to remove the remaining areas. Also has a history of cervical dysplasia on biopsy that was unable to grade her telephone conversation I had with Dr. Dierdre Searles documented 02/10/2012. It was P16 negative and the plan was to repeat colposcopy and Pap smear at a six-month interval which will be June/July 2014.  Exam with Selena Batten assistant External BUS vagina with vulvar lesions noted below. Each lesion was cleansed with Betadine, infiltrated with 1% Xylocaine subcutaneously and excised. Silver nitrate applied for hemostasis to all lesions.  Physical Exam  Genitourinary:      Assessment and plan: 1. Multiple pigmented areas excised. Patient will follow up for pathology results. Postoperative care instructions reviewed. 2. Cervical dysplasia, unable to grade. P16 negative. Plan repeat colposcopy June/July she knows to schedule this and the importance of follow up.

## 2012-04-20 ENCOUNTER — Other Ambulatory Visit: Payer: Self-pay | Admitting: Gynecology

## 2012-08-07 ENCOUNTER — Ambulatory Visit (INDEPENDENT_AMBULATORY_CARE_PROVIDER_SITE_OTHER): Payer: 59 | Admitting: Family Medicine

## 2012-08-07 VITALS — BP 104/67 | HR 73 | Temp 98.1°F | Resp 16 | Ht 69.0 in | Wt 159.4 lb

## 2012-08-07 DIAGNOSIS — H9209 Otalgia, unspecified ear: Secondary | ICD-10-CM

## 2012-08-07 DIAGNOSIS — H9201 Otalgia, right ear: Secondary | ICD-10-CM

## 2012-08-07 DIAGNOSIS — H6121 Impacted cerumen, right ear: Secondary | ICD-10-CM

## 2012-08-07 DIAGNOSIS — H6120 Impacted cerumen, unspecified ear: Secondary | ICD-10-CM

## 2012-08-07 MED ORDER — HYDROCORTISONE-ACETIC ACID 1-2 % OT SOLN: 4.0000 [drp] | Freq: Two times a day (BID) | OTIC | Status: DC

## 2012-08-07 NOTE — Progress Notes (Signed)
 [facility]:  Office Visit  Chief Complaint:  Chief Complaint  Patient presents with  . Ear Fullness    right ear feels clogged    HPI: Shirley Stephenson is a 32 y.o. female who complains of right sided fullness, had a cold several days ago and had some congestion in ear and put a q tip in it and made it worse. Sh eis traveling this weekend on plane and is worried about having ear problems. + itching in ears. Denies ringing or hearing loss.    Past Medical History  Diagnosis Date  . CIN III with severe dysplasia   . Epilepsy   . Anxiety    Past Surgical History  Procedure Laterality Date  . Cervical cone biopsy  2006, 2008    X 2   History   Social History  . Marital Status: Married    Spouse Name: N/A    Number of Children: N/A  . Years of Education: N/A   Social History Main Topics  . Smoking status: Former Games developer  . Smokeless tobacco: Never Used  . Alcohol Use: Yes     Comment: social  . Drug Use: No  . Sexually Active: Yes    Birth Control/ Protection: Pill   Other Topics Concern  . None   Social History Narrative  . None   Family History  Problem Relation Age of Onset  . Heart disease Father   . Cancer Father     Prostate  . Breast cancer Maternal Grandmother 25  . COPD Paternal Grandfather    Allergies  Allergen Reactions  . Imiquimod     aldara - burning  . Levetiracetam     Keppra   Prior to Admission medications   Medication Sig Start Date End Date Taking? Authorizing Provider  ALPRAZolam (XANAX) 0.25 MG tablet Take 0.25 mg by mouth as needed. Very rare   Yes Historical Provider, MD  lamoTRIgine (LAMICTAL) 200 MG tablet Take 300 mg by mouth daily.    Yes Historical Provider, MD  ORSYTHIA 0.1-20 MG-MCG tablet TAKE 1 TABLET BY MOUTH DAILY. 04/20/12  Yes Dara Lords, MD     ROS: The patient denies fevers, chills, night sweats, unintentional weight loss, chest pain, palpitations, wheezing, dyspnea on exertion,  nausea, vomiting, abdominal pain, dysuria, hematuria, melena, numbness, weakness, or tingling.   All other systems have been reviewed and were otherwise negative with the exception of those mentioned in the HPI and as above.    PHYSICAL EXAM: Filed Vitals:   08/07/12 1627  BP: 104/67  Pulse: 73  Temp: 98.1 F (36.7 C)  Resp: 16   Filed Vitals:   08/07/12 1627  Height: 5\' 9"  (1.753 m)  Weight: 159 lb 6.6 oz (72.308 kg)   Body mass index is 23.53 kg/(m^2).  General: Alert, no acute distress HEENT:  Normocephalic, atraumatic, oropharynx patent. Right TM filled with wax, after removal  TM looks normal. No exudates. Cardiovascular:  Regular rate and rhythm, no rubs murmurs or gallops.  No Carotid bruits, radial pulse intact. No pedal edema.  Respiratory: Clear to auscultation bilaterally.  No wheezes, rales, or rhonchi.  No cyanosis, no use of accessory musculature GI: No organomegaly, abdomen is soft and non-tender, positive bowel sounds.  No masses. Skin: No rashes. Neurologic: Facial musculature symmetric. Psychiatric: Patient is appropriate throughout our interaction. Lymphatic: No cervical lymphadenopathy Musculoskeletal: Gait intact.   LABS:    EKG/XRAY:   Primary read interpreted by Dr. Conley Rolls  at [facility].   ASSESSMENT/PLAN: Encounter Diagnoses  Name Primary?  . Otalgia of right ear Yes  . Cerumen impaction, right    Cerumen impaction removal without complications No e/o infection, Tm looks normal Rx Vosol HCT for drying out ear and also for itching F/u prn    ,  PHUONG, DO 08/07/2012 6:25 PM

## 2012-08-13 ENCOUNTER — Ambulatory Visit: Payer: 59 | Admitting: Gynecology

## 2012-08-27 DIAGNOSIS — R569 Unspecified convulsions: Secondary | ICD-10-CM | POA: Insufficient documentation

## 2012-09-05 ENCOUNTER — Ambulatory Visit: Payer: Self-pay | Admitting: Gynecology

## 2012-10-08 DIAGNOSIS — IMO0002 Reserved for concepts with insufficient information to code with codable children: Secondary | ICD-10-CM

## 2012-10-08 HISTORY — DX: Reserved for concepts with insufficient information to code with codable children: IMO0002

## 2012-10-29 ENCOUNTER — Encounter: Payer: Self-pay | Admitting: Gynecology

## 2012-10-29 ENCOUNTER — Ambulatory Visit (INDEPENDENT_AMBULATORY_CARE_PROVIDER_SITE_OTHER): Payer: 59 | Admitting: Gynecology

## 2012-10-29 DIAGNOSIS — N879 Dysplasia of cervix uteri, unspecified: Secondary | ICD-10-CM

## 2012-10-29 DIAGNOSIS — N9089 Other specified noninflammatory disorders of vulva and perineum: Secondary | ICD-10-CM

## 2012-10-29 NOTE — Progress Notes (Signed)
Patient ID: Shirley Stephenson, female   DOB: January 22, 1981, 32 y.o.   MRN: [id number] Patient presents with history of multiple pigmented vulvar lesions that were excised over the several office visits earlier this year all of which showed seborrheic keratoses with one showing condylomatous change. No dysplasia or atypia.  She also has a history of significant cervical dysplasia status post cervical cone biopsy in 2006 and 2008. Her most recent annual Pap smears x2 have been normal but on her physical exam 01/2012 her cervix showed some endocervical tufting grossly and was asked to come back for colposcopy just to make sure given her history. Colposcopic biopsy showed acetowhite change at 12:00 and biopsy showed cervical dysplasia but was unable to grade the severity and VP-16 stain although weakly positive was interpreted by Dr. Dierdre Searles as a negative finding. She was asked to come back at six-month interval for colposcopy.  Exam was Administrator, [employer] vagina with several small pigmented bland-appearing areas as diagrammed. Vagina without visual or palpable abnormalities. Cervix somewhat scarred in the upper vagina with grossly apparent endocervical tufting at the os. Bimanual uterus normal size without tenderness. Adnexa without masses or tenderness  Colposcopy after acetic acid cleanse adequate with scarred changes. Polypoid endocervical mucosal tufting with some acetowhite change at the tips. 2 representative biopsies were taken. Small area of acetowhite change 7:00 transformation zone. Biopsy taken. ECC performed. Physical Exam  Genitourinary:           Assessment and plan: 1. Pigmented vulvar lesions. History of same before all showing seborrheic keratoses with one of them showing some condylomatous change but no atypia. Options to excise these versus self exam and monitoring discussed. Patient prefers just to follow at present which I think is reasonable given her history of benign biopsies  in the past. Certainly if any enlarged or changed in color then she knows to represent for excision. 2. History of recurrent cervical dysplasia with cone biopsies x2 in the past. Most recent Pap smears have been normal but on colposcopy 01/2012 biopsy showed dysplasia unable to grade with slight positive P. 16. Colposcopy today shows some acetowhite change at 7:00 transformation sound. Also some endocervical tufting with some whitish change at the tips questionable inflammatory versus dysplastic. Biopsies of both areas taken. Followup ECC above these areas taken. She will followup for results.  I really viewed very as possibilities. Her cervix is scarred. If we would have to consider LEEP or cone I may refer to GYN oncology for a second opinion. I reviewed increased risk of preterm/previable/peri-viable deliveries with cervical surgeries. We'll further discuss pending biopsy results.

## 2012-10-29 NOTE — Patient Instructions (Signed)
Office will call you with biopsy results 

## 2012-11-01 ENCOUNTER — Telehealth: Payer: Self-pay | Admitting: Gynecology

## 2012-11-01 NOTE — Telephone Encounter (Signed)
I called the patient with her pathology results as follows:  Diagnosis 1. Cervix, biopsy, 7 o'clock - LOW GRADE SQUAMOUS INTRAEPITHELIAL LESION, CIN-I (MILD DYSPLASIA). 2. Cervix, polyp - INFLAMED ENDOCERVICAL TYPE POLYP. - THERE IS NO EVIDENCE OF MALIGNANCY. 3. Endocervix, curettage - LOW GRADE SQUAMOUS INTRAEPITHELIAL LESION, CIN-I (MILD DYSPLASIA).  I reviewed with the patient that the biopsy showed LGSIL as did the ECC. The endocervical tufting was benign. I reviewed that although the ECC was "positive" they were able to graded as a low-grade change. Options for management reviewed to include on her LEEP versus observation. Given her history of 2 prior cone biopsies and scarring of her cervix realistic risk of cone or LEEP now with adverse sequelae from a pregnancy standpoint discussed. Also the issue of not eradicating the HPV virus and potential for recurrences in the future all reviewed with her. After our discussion we both agreed on observation of present with reevaluation in 6 months. I initially will plan on colposcopy with possible ECC at that time depending on findings. Patient asked about pursuing pregnancy at this point. I did not feel low-grade dysplasia a contraindication to pregnancy at this point and I would not necessarily wait to it "clears" as she is at also risk that this may progress requiring treatment but again could affect pregnancy in the future. Patient clearly understands the need for close followup and will return in 6 months for colposcopy.

## 2012-11-29 ENCOUNTER — Other Ambulatory Visit: Payer: Self-pay | Admitting: Gynecology

## 2013-04-07 DIAGNOSIS — IMO0002 Reserved for concepts with insufficient information to code with codable children: Secondary | ICD-10-CM

## 2013-04-07 HISTORY — DX: Reserved for concepts with insufficient information to code with codable children: IMO0002

## 2013-04-29 ENCOUNTER — Other Ambulatory Visit (HOSPITAL_COMMUNITY)
Admission: RE | Admit: 2013-04-29 | Discharge: 2013-04-29 | Disposition: A | Discharge status: Home / Self Care | Payer: 59 | Source: Ambulatory Visit | Attending: Gynecology | Admitting: Gynecology

## 2013-04-29 ENCOUNTER — Ambulatory Visit (INDEPENDENT_AMBULATORY_CARE_PROVIDER_SITE_OTHER): Payer: 59 | Admitting: Gynecology

## 2013-04-29 ENCOUNTER — Encounter: Payer: Self-pay | Admitting: Gynecology

## 2013-04-29 VITALS — BP 110/66 | Ht 68.5 in | Wt 163.0 lb

## 2013-04-29 DIAGNOSIS — Z124 Encounter for screening for malignant neoplasm of cervix: Secondary | ICD-10-CM | POA: Insufficient documentation

## 2013-04-29 DIAGNOSIS — Z01419 Encounter for gynecological examination (general) (routine) without abnormal findings: Secondary | ICD-10-CM

## 2013-04-29 DIAGNOSIS — Z1151 Encounter for screening for human papillomavirus (HPV): Secondary | ICD-10-CM | POA: Insufficient documentation

## 2013-04-29 DIAGNOSIS — IMO0002 Reserved for concepts with insufficient information to code with codable children: Secondary | ICD-10-CM

## 2013-04-29 DIAGNOSIS — R6889 Other general symptoms and signs: Secondary | ICD-10-CM

## 2013-04-29 DIAGNOSIS — N9089 Other specified noninflammatory disorders of vulva and perineum: Secondary | ICD-10-CM

## 2013-04-29 DIAGNOSIS — N898 Other specified noninflammatory disorders of vagina: Secondary | ICD-10-CM

## 2013-04-29 LAB — CBC WITH DIFFERENTIAL/PLATELET
Basophils Absolute: 0.1 K/uL (ref 0.0–0.1)
Basophils Relative: 1 % (ref 0–1)
Eosinophils Absolute: 0.1 K/uL (ref 0.0–0.7)
Eosinophils Relative: 2 % (ref 0–5)
HCT: 43.3 % (ref 36.0–46.0)
Hemoglobin: 14.5 g/dL (ref 12.0–15.0)
Lymphocytes Relative: 28 % (ref 12–46)
Lymphs Abs: 1.9 K/uL (ref 0.7–4.0)
MCH: 30.6 pg (ref 26.0–34.0)
MCHC: 33.5 g/dL (ref 30.0–36.0)
MCV: 91.4 fL (ref 78.0–100.0)
Monocytes Absolute: 0.4 K/uL (ref 0.1–1.0)
Monocytes Relative: 6 % (ref 3–12)
Neutro Abs: 4.3 K/uL (ref 1.7–7.7)
Neutrophils Relative %: 63 % (ref 43–77)
Platelets: 303 K/uL (ref 150–400)
RBC: 4.74 MIL/uL (ref 3.87–5.11)
RDW: 12.7 % (ref 11.5–15.5)
WBC: 6.9 K/uL (ref 4.0–10.5)

## 2013-04-29 LAB — WET PREP FOR TRICH, YEAST, CLUE
Clue Cells Wet Prep HPF POC: NONE SEEN
Trich, Wet Prep: NONE SEEN

## 2013-04-29 MED ORDER — FLUCONAZOLE 150 MG PO TABS: 150.0000 mg | ORAL_TABLET | Freq: Once | ORAL | Status: DC

## 2013-04-29 MED ORDER — LEVONORGESTREL-ETHINYL ESTRAD 0.1-20 MG-MCG PO TABS: ORAL_TABLET | ORAL | Status: DC

## 2013-04-29 NOTE — Patient Instructions (Addendum)
Office will call you with the biopsy results and Pap smear results.  Take Diflucan pill once and repeat it in 2 days  You may obtain a copy of any labs that were done today by logging onto MyChart as outlined in the instructions provided with your AVS (after visit summary). The office will not call with normal lab results but certainly if there are any significant abnormalities then we will contact you.   Health Maintenance, Female A healthy lifestyle and preventative care can promote health and wellness.  Maintain regular health, dental, and eye exams.  Eat a healthy diet. Foods like vegetables, fruits, whole grains, low-fat dairy products, and lean protein foods contain the nutrients you need without too many calories. Decrease your intake of foods high in solid fats, added sugars, and salt. Get information about a proper diet from your caregiver, if necessary.  Regular physical exercise is one of the most important things you can do for your health. Most adults should get at least 150 minutes of moderate-intensity exercise (any activity that increases your heart rate and causes you to sweat) each week. In addition, most adults need muscle-strengthening exercises on 2 or more days a week.   Maintain a healthy weight. The body mass index (BMI) is a screening tool to identify possible weight problems. It provides an estimate of body fat based on height and weight. Your caregiver can help determine your BMI, and can help you achieve or maintain a healthy weight. For adults 20 years and older:  A BMI below 18.5 is considered underweight.  A BMI of 18.5 to 24.9 is normal.  A BMI of 25 to 29.9 is considered overweight.  A BMI of 30 and above is considered obese.  Maintain normal blood lipids and cholesterol by exercising and minimizing your intake of saturated fat. Eat a balanced diet with plenty of fruits and vegetables. Blood tests for lipids and cholesterol should begin at age 42 and be  repeated every 5 years. If your lipid or cholesterol levels are high, you are over 50, or you are a high risk for heart disease, you may need your cholesterol levels checked more frequently.Ongoing high lipid and cholesterol levels should be treated with medicines if diet and exercise are not effective.  If you smoke, find out from your caregiver how to quit. If you do not use tobacco, do not start.  Lung cancer screening is recommended for adults aged 9 80 years who are at high risk for developing lung cancer because of a history of smoking. Yearly low-dose computed tomography (CT) is recommended for people who have at least a 30-pack-year history of smoking and are a current smoker or have quit within the past 15 years. A pack year of smoking is smoking an average of 1 pack of cigarettes a day for 1 year (for example: 1 pack a day for 30 years or 2 packs a day for 15 years). Yearly screening should continue until the smoker has stopped smoking for at least 15 years. Yearly screening should also be stopped for people who develop a health problem that would prevent them from having lung cancer treatment.  If you are pregnant, do not drink alcohol. If you are breastfeeding, be very cautious about drinking alcohol. If you are not pregnant and choose to drink alcohol, do not exceed 1 drink per day. One drink is considered to be 12 ounces (355 mL) of beer, 5 ounces (148 mL) of wine, or 1.5 ounces (44 mL) of  liquor.  Avoid use of street drugs. Do not share needles with anyone. Ask for help if you need support or instructions about stopping the use of drugs.  High blood pressure causes heart disease and increases the risk of stroke. Blood pressure should be checked at least every 1 to 2 years. Ongoing high blood pressure should be treated with medicines, if weight loss and exercise are not effective.  If you are 81 to 35 years old, ask your caregiver if you should take aspirin to prevent strokes.  Diabetes  screening involves taking a blood sample to check your fasting blood sugar level. This should be done once every 3 years, after age 85, if you are within normal weight and without risk factors for diabetes. Testing should be considered at a younger age or be carried out more frequently if you are overweight and have at least 1 risk factor for diabetes.  Breast cancer screening is essential preventative care for women. You should practice "breast self-awareness." This means understanding the normal appearance and feel of your breasts and may include breast self-examination. Any changes detected, no matter how small, should be reported to a caregiver. Women in their 55s and 30s should have a clinical breast exam (CBE) by a caregiver as part of a regular health exam every 1 to 3 years. After age 50, women should have a CBE every year. Starting at age 53, women should consider having a mammogram (breast X-ray) every year. Women who have a family history of breast cancer should talk to their caregiver about genetic screening. Women at a high risk of breast cancer should talk to their caregiver about having an MRI and a mammogram every year.  Breast cancer gene (BRCA)-related cancer risk assessment is recommended for women who have family members with BRCA-related cancers. BRCA-related cancers include breast, ovarian, tubal, and peritoneal cancers. Having family members with these cancers may be associated with an increased risk for harmful changes (mutations) in the breast cancer genes BRCA1 and BRCA2. Results of the assessment will determine the need for genetic counseling and BRCA1 and BRCA2 testing.  The Pap test is a screening test for cervical cancer. Women should have a Pap test starting at age 37. Between ages 4 and 80, Pap tests should be repeated every 2 years. Beginning at age 60, you should have a Pap test every 3 years as long as the past 3 Pap tests have been normal. If you had a hysterectomy for a  problem that was not cancer or a condition that could lead to cancer, then you no longer need Pap tests. If you are between ages 13 and 68, and you have had normal Pap tests going back 10 years, you no longer need Pap tests. If you have had past treatment for cervical cancer or a condition that could lead to cancer, you need Pap tests and screening for cancer for at least 20 years after your treatment. If Pap tests have been discontinued, risk factors (such as a new sexual partner) need to be reassessed to determine if screening should be resumed. Some women have medical problems that increase the chance of getting cervical cancer. In these cases, your caregiver may recommend more frequent screening and Pap tests.  The human papillomavirus (HPV) test is an additional test that may be used for cervical cancer screening. The HPV test looks for the virus that can cause the cell changes on the cervix. The cells collected during the Pap test can be tested for  HPV. The HPV test could be used to screen women aged 61 years and older, and should be used in women of any age who have unclear Pap test results. After the age of 67, women should have HPV testing at the same frequency as a Pap test.  Colorectal cancer can be detected and often prevented. Most routine colorectal cancer screening begins at the age of 86 and continues through age 78. However, your caregiver may recommend screening at an earlier age if you have risk factors for colon cancer. On a yearly basis, your caregiver may provide home test kits to check for hidden blood in the stool. Use of a small camera at the end of a tube, to directly examine the colon (sigmoidoscopy or colonoscopy), can detect the earliest forms of colorectal cancer. Talk to your caregiver about this at age 66, when routine screening begins. Direct examination of the colon should be repeated every 5 to 10 years through age 67, unless early forms of pre-cancerous polyps or small growths  are found.  Hepatitis C blood testing is recommended for all people born from [birth year] through 1967 and any individual with known risks for hepatitis C.  Practice safe sex. Use condoms and avoid high-risk sexual practices to reduce the spread of sexually transmitted infections (STIs). Sexually active women aged 56 and younger should be checked for Chlamydia, which is a common sexually transmitted infection. Older women with new or multiple partners should also be tested for Chlamydia. Testing for other STIs is recommended if you are sexually active and at increased risk.  Osteoporosis is a disease in which the bones lose minerals and strength with aging. This can result in serious bone fractures. The risk of osteoporosis can be identified using a bone density scan. Women ages 30 and over and women at risk for fractures or osteoporosis should discuss screening with their caregivers. Ask your caregiver whether you should be taking a calcium supplement or vitamin D to reduce the rate of osteoporosis.  Menopause can be associated with physical symptoms and risks. Hormone replacement therapy is available to decrease symptoms and risks. You should talk to your caregiver about whether hormone replacement therapy is right for you.  Use sunscreen. Apply sunscreen liberally and repeatedly throughout the day. You should seek shade when your shadow is shorter than you. Protect yourself by wearing long sleeves, pants, a wide-brimmed hat, and sunglasses year round, whenever you are outdoors.  Notify your caregiver of new moles or changes in moles, especially if there is a change in shape or color. Also notify your caregiver if a mole is larger than the size of a pencil eraser.  Stay current with your immunizations. Document Released: 08/09/2010 Document Revised: 05/21/2012 Document Reviewed: 08/09/2010 [facility] Patient Information 2014 [place].

## 2013-04-29 NOTE — Addendum Note (Signed)
Addended by: Dayna BarkerGARDNER, Lauren Modisette K on: 04/29/2013 03:03 PM   Modules accepted: Orders

## 2013-04-29 NOTE — Progress Notes (Signed)
Patient ID: Shirley Stephenson, female   DOB: [birth year], 33 y.o.   MRN: [id number] Shirley Stephenson 1981-02-07 [id number]        33 y.o.  [id number] presents with several issues:  1. Is due/overdue for her annual exam and we'll go ahead and do that today. 2. History of significant cervical dysplasia, CIN 3 status post cone biopsy x2 in 2006 and 2008. Pap smears have been negative but on exam 01/2012 she had some endocervical tufting grossly where followup colposcopy showed acetowhite change at 12:00 and biopsy showed cervical dysplasia but difficult to grade. Immunostaining for P-16 was weak and the patient was followed expectantly. Followup colposcopy 10/2012 showed acetowhite change at 7:00. Represent a biopsy showed LGSIL and ECC was also positive for LGSIL. Patient presents for followup colposcopy. 3. Recurrent pigmented vulvar lesions. Most recently had multiple areas excised 03/2012 and they all returned either seborrheic keratoses or low-grade condylomatous change.   Past medical history,surgical history, problem list, medications, allergies, family history and social history were all reviewed and documented in the EPIC chart.  ROS:  Performed and pertinent positives and negatives are included in the history, assessment and plan .  Exam: Shirley Stephenson assistant Filed Vitals:   04/29/13 1418  BP: 110/66   General appearance  Normal Skin grossly normal Head/Neck normal with no cervical or supraclavicular adenopathy thyroid normal Lungs  clear Cardiac RR, without RMG Abdominal  soft, nontender, without masses, organomegaly or hernia Breasts  examined lying and sitting without masses, retractions, discharge or axillary adenopathy. Pelvic  Ext/BUS/vagina with multiple pigmented flat lesions from mons pubis along both labia majora. Slight white discharge  Cervix scarred at upper vaginal vault. Pap/HPV  Uterus anteverted, normal size, shape and contour, midline and mobile nontender   Adnexa  Without  masses or tenderness    Anus and perineum  Normal   Rectovaginal  Normal sphincter tone without palpated masses or tenderness.  Physical Exam  Genitourinary:         Colposcopy performed, acetic acid cleanse is adequate without abnormalities noted. Prior LEEP scarring noted. ECC performed  Assessment/Plan:  33 y.o. [id number] female for annual exam.   1. Using continuous 20 mcg BCPs with every other to every third month withdrawal. Doing well and wants to continue. I refilled her x1 year. She is under the care with a neurologist who follows her epilepsy and is comfortable with her continuing the birth control pills. She does not smoke and is not being followed for any other medical issues. We have discussed the risks include stroke heart attack DVT she is comfortable continuing. 2. Vaginal irritation with intercourse. Slight white discharge noted. Wet prep positive for yeast. Will treat with Diflucan 150 mg x2 doses every other day. Followup if symptoms persist or recur. 3. History of high-grade dysplasia. Most recent colposcopy with biopsy both biopsy and ECC showing LGSIL 10/2012. Colposcopy is normal today with old scarring. Pap/HPV and ECC performed. The patient will follow up for results in minimal triage based upon these results. 4. Recurrent pigmented vulvar lesions. Has had for years. Had multiple lesions excised last year all of which were seborrheic keratoses/early condylomatous change. All lesions today and looked bland similar to past lesions. Options to excise versus observe discussed. Patient is comfortable with observation. She'll report any changes such as enlargement or changes in pigmentation. 5. Breast health.  History of right breast mass  evaluated in the past with mammography and ultrasound suggesting a fibroadenoma. I could not feel this  last annual exam and I cannot feel this area now nor can the patient as in the past. We'll plan expectant management with mammogram closer to  38. SBE monthly reviewed. Report any palpable abnormalities. 6. Health maintenance. Baseline CBC comprehensive metabolic panel lipid profile urinalysis ordered. Followup for biopsy results and Pap smear results. Otherwise followup in one year for annual exam.   Note: This document was prepared with digital dictation and possible smart phrase technology. Any transcriptional errors that result from this process are unintentional.   Shirley Stephenson,Shirley Stephenson P MD, 2:41 PM 04/29/2013

## 2013-04-30 LAB — URINALYSIS W MICROSCOPIC + REFLEX CULTURE
Bacteria, UA: NONE SEEN
Bilirubin Urine: NEGATIVE
Casts: NONE SEEN
Crystals: NONE SEEN
Glucose, UA: NEGATIVE mg/dL
HGB URINE DIPSTICK: NEGATIVE
Ketones, ur: NEGATIVE mg/dL
NITRITE: NEGATIVE
PROTEIN: NEGATIVE mg/dL
Specific Gravity, Urine: 1.014 (ref 1.005–1.030)
Squamous Epithelial / LPF: NONE SEEN
Urobilinogen, UA: 0.2 mg/dL (ref 0.0–1.0)
pH: 5.5 (ref 5.0–8.0)

## 2013-04-30 LAB — LIPID PANEL
CHOL/HDL RATIO: 2.8 ratio
Cholesterol: 196 mg/dL (ref 0–200)
HDL: 70 mg/dL (ref >39)
LDL CALC: 109 mg/dL — AB (ref 0–99)
Triglycerides: 84 mg/dL (ref <150)
VLDL: 17 mg/dL (ref 0–40)

## 2013-04-30 LAB — COMPREHENSIVE METABOLIC PANEL
ALBUMIN: 4.4 g/dL (ref 3.5–5.2)
ALK PHOS: 45 U/L (ref 39–117)
ALT: 22 U/L (ref 0–35)
AST: 20 U/L (ref 0–37)
BUN: 10 mg/dL (ref 6–23)
CO2: 28 mEq/L (ref 19–32)
CREATININE: 0.73 mg/dL (ref 0.50–1.10)
Calcium: 9.4 mg/dL (ref 8.4–10.5)
Chloride: 103 mEq/L (ref 96–112)
Glucose, Bld: 79 mg/dL (ref 70–99)
POTASSIUM: 4.3 meq/L (ref 3.5–5.3)
Sodium: 140 mEq/L (ref 135–145)
Total Bilirubin: 0.4 mg/dL (ref 0.2–1.2)
Total Protein: 6.6 g/dL (ref 6.0–8.3)

## 2013-05-01 LAB — URINE CULTURE: Colony Count: 8000

## 2013-05-03 ENCOUNTER — Encounter: Payer: Self-pay | Admitting: Gynecology

## 2013-10-28 ENCOUNTER — Ambulatory Visit (INDEPENDENT_AMBULATORY_CARE_PROVIDER_SITE_OTHER): Payer: 59 | Admitting: Family Medicine

## 2013-10-28 VITALS — BP 98/64 | HR 70 | Temp 98.3°F | Resp 16 | Ht 69.0 in | Wt 169.8 lb

## 2013-10-28 DIAGNOSIS — R569 Unspecified convulsions: Secondary | ICD-10-CM

## 2013-10-28 NOTE — Progress Notes (Signed)
Chief Complaint:  Chief Complaint  Patient presents with  . Annual Exam  . Referral    to neurology    HPI: Laural Stephenson is a 33 y.o. female who is here for need for referral. She has a h/o seizures needs referral due to new insurance. No seizures since 2010. She has no complaints. She has seen ob gyn who has done all annual labs in March 2015. She will get one with him again next spring.   Past Medical History  Diagnosis Date  . CIN III with severe dysplasia   . Epilepsy   . Anxiety   . 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 ASCUS favor benign 04/2013    Ascus Pap smear negative high-risk HPV, negative ECC recommend followup Pap/HPV one year   Past Surgical History  Procedure Laterality Date  . Cervical cone biopsy  2006, 2008    X 2   History   Social History  . Marital Status: Married    Spouse Name: N/A    Number of Children: N/A  . Years of Education: N/A   Social History Main Topics  . Smoking status: Former Games developer  . Smokeless tobacco: Never Used  . Alcohol Use: 2.5 oz/week    5 drink(s) per week     Comment: social  . Drug Use: No  . Sexual Activity: Yes    Birth Control/ Protection: Pill   Other Topics Concern  . None   Social History Narrative  . None   Family History  Problem Relation Age of Onset  . Heart disease Father   . Cancer Father     Prostate  . Breast cancer Maternal Grandmother 48  . COPD Paternal Grandfather    Allergies  Allergen Reactions  . Imiquimod     aldara - burning  . Levetiracetam     Keppra   Prior to Admission medications   Medication Sig Start Date End Date Taking? Authorizing Provider  lamoTRIgine (LAMICTAL) 200 MG tablet Take 300 mg by mouth daily.    Yes Historical Provider, MD  levonorgestrel-ethinyl estradiol (ORSYTHIA) 0.1-20 MG-MCG tablet TAKE 1 TABLET BY MOUTH DAILY. FOR CONTINUOUS USE 04/29/13  Yes Shirley Lords, MD     ROS: The  patient denies fevers, chills, night sweats, unintentional weight loss, chest pain, palpitations, wheezing, dyspnea on exertion, nausea, vomiting, abdominal pain, dysuria, hematuria, melena, numbness, weakness, or tingling.   All other systems have been reviewed and were otherwise negative with the exception of those mentioned in the HPI and as above.    PHYSICAL EXAM: Filed Vitals:   10/28/13 0915  BP: 98/64  Pulse: 70  Temp: 98.3 F (36.8 C)  Resp: 16   Filed Vitals:   10/28/13 0915  Height:  (1.753 m)  Weight: 169 lb 12.8 oz (77.021 kg)   Body mass index is 25.06 kg/(m^2).  General: Alert, no acute distress HEENT:  Normocephalic, atraumatic, oropharynx patent. EOMI, PERRLA Cardiovascular:  Regular rate and rhythm, no rubs murmurs or gallops.  No Carotid bruits, radial pulse intact. No pedal edema.  Respiratory: Clear to auscultation bilaterally.  No wheezes, rales, or rhonchi.  No cyanosis, no use of accessory musculature GI: No organomegaly, abdomen is soft and non-tender, positive bowel sounds.  No masses. Skin: No rashes. Neurologic: Facial musculature symmetric. CN 2-12 grossly normal Psychiatric: Patient is appropriate throughout our interaction. Lymphatic: No cervical lymphadenopathy Musculoskeletal: Gait intact.  LABS:    EKG/XRAY:   Primary read interpreted by Dr. Conley Rolls at [facility].   ASSESSMENT/PLAN: Encounter Diagnosis  Name Primary?  . Seizures Yes   Refer to neurology her current one due to changes in insurance and requires this UTD on annual labs and visit with Dr Audie Box from 04/2013.  F/u prn    Gross sideeffects, risk and benefits, and alternatives of medications d/w patient. Patient is aware that all medications have potential sideeffects and we are unable to predict every sideeffect or drug-drug interaction that may occur.  Shirley Kopka PHUONG, DO 10/28/2013 10:06 AM

## 2013-12-10 ENCOUNTER — Ambulatory Visit (INDEPENDENT_AMBULATORY_CARE_PROVIDER_SITE_OTHER): Payer: 59 | Admitting: Gynecology

## 2013-12-10 ENCOUNTER — Encounter: Payer: Self-pay | Admitting: Gynecology

## 2013-12-10 DIAGNOSIS — N912 Amenorrhea, unspecified: Secondary | ICD-10-CM

## 2013-12-10 LAB — PREGNANCY, URINE: Preg Test, Ur: POSITIVE

## 2013-12-10 NOTE — Progress Notes (Signed)
Shirley Stephenson 1980-03-01 [id number]        33 y.o.  [id number] Presents with a positive home pregnancy test. Patient was taking oral contraceptives continuously although stop them for 2 weeks the beginning of August due to forgetting to take them on vacation. Started to notice breast tenderness and morning nausea and checked a home pregnancy test which was positive.  Past medical history,surgical history, problem list, medications, allergies, family history and social history were all reviewed and documented in the EPIC chart.  Directed ROS with pertinent positives and negatives documented in the history of present illness/assessment and plan.  Exam: Kim assistant General appearance:  Normal External BUS vagina with scattered hyperpigmented flat vulvar lesions consistent with her history of condyloma/VIN-I. Cervix scarred. Uterus approximately 8 weeks size midline mobile nontender. Adnexa without masses or tenderness  Assessment/Plan:  33 y.o. [id number] early pregnant. Plans to continue. Will check baseline ultrasound for dating and viability. Patient is to make an appointment with an obstetrician in town for new OB appointment now given her other issues. Reviewed possible increased risk of incompetent cervix due to Cone biopsy 2. Need to alert the obstetrician of this reviewed. Also is following up with her neurologist in reference to her seizure and Lamictal use. Issues of teratogenicity both with oral contraceptives and Lamictal reviewed.  Start on prenatal vitamin.     Dara LordsFONTAINE,TIMOTHY P MD, 11:20 AM 12/10/2013

## 2013-12-10 NOTE — Patient Instructions (Signed)
Follow up for ultrasound as scheduled. Make an appointment to see an obstetrician for new OB appointment now. Follow up with your neurologist in reference to your pregnancy diagnoses. Start on a prenatal vitamin daily.

## 2013-12-10 NOTE — Addendum Note (Signed)
Addended by: Dayna BarkerGARDNER, KIMBERLY K on: 12/10/2013 11:26 AM   Modules accepted: Orders, Medications

## 2013-12-19 ENCOUNTER — Ambulatory Visit (INDEPENDENT_AMBULATORY_CARE_PROVIDER_SITE_OTHER): Payer: 59 | Admitting: Gynecology

## 2013-12-19 ENCOUNTER — Other Ambulatory Visit: Payer: Self-pay | Admitting: Gynecology

## 2013-12-19 ENCOUNTER — Encounter: Payer: Self-pay | Admitting: Gynecology

## 2013-12-19 ENCOUNTER — Ambulatory Visit (INDEPENDENT_AMBULATORY_CARE_PROVIDER_SITE_OTHER): Payer: 59

## 2013-12-19 DIAGNOSIS — Z3490 Encounter for supervision of normal pregnancy, unspecified, unspecified trimester: Secondary | ICD-10-CM

## 2013-12-19 DIAGNOSIS — N912 Amenorrhea, unspecified: Secondary | ICD-10-CM

## 2013-12-19 DIAGNOSIS — Z36 Encounter for antenatal screening of mother: Secondary | ICD-10-CM

## 2013-12-19 DIAGNOSIS — O09522 Supervision of elderly multigravida, second trimester: Secondary | ICD-10-CM

## 2013-12-19 DIAGNOSIS — Z331 Pregnant state, incidental: Secondary | ICD-10-CM

## 2013-12-19 DIAGNOSIS — Z3687 Encounter for antenatal screening for uncertain dates: Secondary | ICD-10-CM

## 2013-12-19 LAB — US OB COMP + 14 WK

## 2013-12-19 NOTE — Patient Instructions (Signed)
Follow up with the obstetricians within the next 1-2 weeks.

## 2013-12-19 NOTE — Progress Notes (Signed)
Shirley HeysCaitlin Stephenson Apr 10, 1980 [id number]        33 y.o.  [id number] presents for ultrasound for viability and dates.  Past medical history,surgical history, problem list, medications, allergies, family history and social history were all reviewed and documented in the EPIC chart.  Directed ROS with pertinent positives and negatives documented in the history of present illness/assessment and plan.  Ultrasound shows single viable IUP at 16 weeks. Limited anatomic scan normal. Echogenic focus left ventricle noted.  Assessment/Plan:  33 y.o. [id number] with viable IUP at 16 weeks. Did not feel that far along but measurements all consistent for 16 weeks. Strongly recommended to the patient and her husband that they accelerate the new OB appointment to be seen within 1-2 weeks and I provided a copy of the ultrasound report to take with them. Patient continues on prenatal vitamins daily.     Dara LordsFONTAINE,Shloima Clinch P MD, 2:55 PM 12/19/2013

## 2014-01-24 ENCOUNTER — Telehealth: Payer: Self-pay

## 2014-01-24 LAB — OB RESULTS CONSOLE ANTIBODY SCREEN: ANTIBODY SCREEN: NEGATIVE

## 2014-01-24 LAB — OB RESULTS CONSOLE HIV ANTIBODY (ROUTINE TESTING): HIV: NONREACTIVE

## 2014-01-24 LAB — OB RESULTS CONSOLE RUBELLA ANTIBODY, IGM: RUBELLA: NON-IMMUNE/NOT IMMUNE

## 2014-01-24 LAB — OB RESULTS CONSOLE ABO/RH: RH Type: NEGATIVE

## 2014-01-24 LAB — OB RESULTS CONSOLE GC/CHLAMYDIA
Chlamydia: NEGATIVE
Gonorrhea: NEGATIVE

## 2014-01-24 LAB — OB RESULTS CONSOLE HEPATITIS B SURFACE ANTIGEN: Hepatitis B Surface Ag: NEGATIVE

## 2014-01-24 LAB — OB RESULTS CONSOLE RPR: RPR: NONREACTIVE

## 2014-01-24 NOTE — Telephone Encounter (Signed)
Patient called requesting a referral for [facility] from Dr. Conley RollsLe. She was seen today by Midwife Raelyn Moraolitta Dawson. For pregnancy. Called [facility] to confirm to [facility]. Dois DavenportSandra and Lupita LeashDonna from billing assured me that pt misunderstood and they do not require one for patients insurance- they dont classify this as an office visit but an obgyn one. If patient calls back I will let her know not to worry. I wanted to document because I did try to do compass referral online for her but [facility] says they do not need it.

## 2014-02-07 NOTE — L&D Delivery Note (Signed)
Delivery Note  First Stage: Labor onset: [id number] / hospital admit @ 0100 @ 5cm dilated Augmentation :none Analgesia /Anesthesia intrapartum: water immersion SROM at 0223 clear with strong to push  Category 1 in MAU with labor assess - difficulty graphing due to maternal movement and positioning on arrival to BS IV pulled out prior to ABX infusion with maternal movement - declined restart  Second Stage: Complete dilation at 0224 onset of involuntary pushing at 0225 - requests to enter tub FHR 140-150 with (+) accels with transducer held by nurse   water temp 98.6 assisted into tub at 0245 - supine with legs on side of tub for pushing FHR second stage 110-130 intermittent doppler  Delivery of a viable female at 0307 by CNM in ROA position assisted by mother  newborn brought above water surface to maternal chest for skin-to-skin no nuchal cord cord double clamped after cessation of pulsation, cut by FOB  Baby to Dad for skin-to-skin Mom assisted out of tub to bed @ 0315  cord blood sample collected  collection of cord blood donation completed  Third Stage: [facility] with 3 VC @ 0320 placenta disposition: for encapsulation uterine tone firm / bleeding scant <13700ml  1st degree perineal laceration identified  Anesthesia for repair: 1% xylocaine local Repair 4-0 vicryl subcuticular Est. Blood Loss (mL): 200ml   Complications: none  Mom to postpartum.  Baby to Couplet care / Skin to Skin.  Newborn: Birth Weight: 6 pounds 12 ounces  Apgar Scores: 9-9 Feeding planned: breast  Marlinda MikeBAILEY, Kerri-Anne Haeberle CNM, MSN, FACNM 06/17/2014, 3:37 AM

## 2014-05-12 LAB — OB RESULTS CONSOLE GBS: GBS: POSITIVE

## 2014-06-15 ENCOUNTER — Inpatient Hospital Stay (HOSPITAL_COMMUNITY)
Admission: AD | Admit: 2014-06-15 | Discharge: 2014-06-15 | Disposition: A | Discharge status: Home / Self Care | Payer: BLUE CROSS/BLUE SHIELD | Source: Ambulatory Visit | Attending: Obstetrics and Gynecology | Admitting: Obstetrics and Gynecology

## 2014-06-15 ENCOUNTER — Encounter (HOSPITAL_COMMUNITY): Payer: Self-pay

## 2014-06-15 DIAGNOSIS — O480 Post-term pregnancy: Secondary | ICD-10-CM | POA: Insufficient documentation

## 2014-06-15 DIAGNOSIS — O368131 Decreased fetal movements, third trimester, fetus 1: Secondary | ICD-10-CM

## 2014-06-15 DIAGNOSIS — O368130 Decreased fetal movements, third trimester, not applicable or unspecified: Secondary | ICD-10-CM | POA: Insufficient documentation

## 2014-06-15 DIAGNOSIS — Z87891 Personal history of nicotine dependence: Secondary | ICD-10-CM | POA: Diagnosis not present

## 2014-06-15 DIAGNOSIS — Z3689 Encounter for other specified antenatal screening: Secondary | ICD-10-CM

## 2014-06-15 DIAGNOSIS — Z3A41 41 weeks gestation of pregnancy: Secondary | ICD-10-CM | POA: Diagnosis not present

## 2014-06-15 NOTE — Discharge Instructions (Signed)
Braxton Hicks Contractions °Contractions of the uterus can occur throughout pregnancy. Contractions are not always a sign that you are in labor.  °WHAT ARE BRAXTON HICKS CONTRACTIONS?  °Contractions that occur before labor are called Braxton Hicks contractions, or false labor. Toward the end of pregnancy (32-34 weeks), these contractions can develop more often and may become more forceful. This is not true labor because these contractions do not result in opening (dilatation) and thinning of the cervix. They are sometimes difficult to tell apart from true labor because these contractions can be forceful and people have different pain tolerances. You should not feel embarrassed if you go to the hospital with false labor. Sometimes, the only way to tell if you are in true labor is for your health care provider to look for changes in the cervix. °If there are no prenatal problems or other health problems associated with the pregnancy, it is completely safe to be sent home with false labor and await the onset of true labor. °HOW CAN YOU TELL THE DIFFERENCE BETWEEN TRUE AND FALSE LABOR? °False Labor °· The contractions of false labor are usually shorter and not as hard as those of true labor.   °· The contractions are usually irregular.   °· The contractions are often felt in the front of the lower abdomen and in the groin.   °· The contractions may go away when you walk around or change positions while lying down.   °· The contractions get weaker and are shorter lasting as time goes on.   °· The contractions do not usually become progressively stronger, regular, and closer together as with true labor.   °True Labor °· Contractions in true labor last 30-70 seconds, become very regular, usually become more intense, and increase in frequency.   °· The contractions do not go away with walking.   °· The discomfort is usually felt in the top of the uterus and spreads to the lower abdomen and low back.   °· True labor can be  determined by your health care provider with an exam. This will show that the cervix is dilating and getting thinner.   °WHAT TO REMEMBER °· Keep up with your usual exercises and follow other instructions given by your health care provider.   °· Take medicines as directed by your health care provider.   °· Keep your regular prenatal appointments.   °· Eat and drink lightly if you think you are going into labor.   °· If Braxton Hicks contractions are making you uncomfortable:   °¨ Change your position from lying down or resting to walking, or from walking to resting.   °¨ Sit and rest in a tub of warm water.   °¨ Drink 2-3 glasses of water. Dehydration may cause these contractions.   °¨ Do slow and deep breathing several times an hour.   °WHEN SHOULD I SEEK IMMEDIATE MEDICAL CARE? °Seek immediate medical care if: °· Your contractions become stronger, more regular, and closer together.   °· You have fluid leaking or gushing from your vagina.   °· You have a fever.   °· You pass blood-tinged mucus.   °· You have vaginal bleeding.   °· You have continuous abdominal pain.   °· You have low back pain that you never had before.   °· You feel your baby's head pushing down and causing pelvic pressure.   °· Your baby is not moving as much as it used to.   °Document Released: 01/24/2005 Document Revised: 01/29/2013 Document Reviewed: 11/05/2012 °ExitCare® Patient Information ©2015 [employer]. This information is not intended to replace advice given to you by your health care   provider. Make sure you discuss any questions you have with your health care provider. °Fetal Movement Counts °Patient Name: __________________________________________________ Patient Due Date: ____________________ °Performing a fetal movement count is highly recommended in high-risk pregnancies, but it is good for every pregnant woman to do. Your health care provider may ask you to start counting fetal movements at 28 weeks of the pregnancy. Fetal  movements often increase: °· After eating a full meal. °· After physical activity. °· After eating or drinking something sweet or cold. °· At rest. °Pay attention to when you feel the baby is most active. This will help you notice a pattern of your baby's sleep and wake cycles and what factors contribute to an increase in fetal movement. It is important to perform a fetal movement count at the same time each day when your baby is normally most active.  °HOW TO COUNT FETAL MOVEMENTS °· Find a quiet and comfortable area to sit or lie down on your left side. Lying on your left side provides the best blood and oxygen circulation to your baby. °· Write down the day and time on a sheet of paper or in a journal. °· Start counting kicks, flutters, swishes, rolls, or jabs in a 2-hour period. You should feel at least 10 movements within 2 hours. °· If you do not feel 10 movements in 2 hours, wait 2-3 hours and count again. Look for a change in the pattern or not enough counts in 2 hours. °SEEK MEDICAL CARE IF: °· You feel less than 10 counts in 2 hours, tried twice. °· There is no movement in over an hour. °· The pattern is changing or taking longer each day to reach 10 counts in 2 hours. °· You feel the baby is not moving as he or she usually does. °Date: ____________ Movements: ____________ Start time: ____________ Finish time: ____________  °Date: ____________ Movements: ____________ Start time: ____________ Finish time: ____________ °Date: ____________ Movements: ____________ Start time: ____________ Finish time: ____________ °Date: ____________ Movements: ____________ Start time: ____________ Finish time: ____________ °Date: ____________ Movements: ____________ Start time: ____________ Finish time: ____________ °Date: ____________ Movements: ____________ Start time: ____________ Finish time: ____________ °Date: ____________ Movements: ____________ Start time: ____________ Finish time: ____________ °Date: ____________  Movements: ____________ Start time: ____________ Finish time: ____________  °Date: ____________ Movements: ____________ Start time: ____________ Finish time: ____________ °Date: ____________ Movements: ____________ Start time: ____________ Finish time: ____________ °Date: ____________ Movements: ____________ Start time: ____________ Finish time: ____________ °Date: ____________ Movements: ____________ Start time: ____________ Finish time: ____________ °Date: ____________ Movements: ____________ Start time: ____________ Finish time: ____________ °Date: ____________ Movements: ____________ Start time: ____________ Finish time: ____________ °Date: ____________ Movements: ____________ Start time: ____________ Finish time: ____________  °Date: ____________ Movements: ____________ Start time: ____________ Finish time: ____________ °Date: ____________ Movements: ____________ Start time: ____________ Finish time: ____________ °Date: ____________ Movements: ____________ Start time: ____________ Finish time: ____________ °Date: ____________ Movements: ____________ Start time: ____________ Finish time: ____________ °Date: ____________ Movements: ____________ Start time: ____________ Finish time: ____________ °Date: ____________ Movements: ____________ Start time: ____________ Finish time: ____________ °Date: ____________ Movements: ____________ Start time: ____________ Finish time: ____________  °Date: ____________ Movements: ____________ Start time: ____________ Finish time: ____________ °Date: ____________ Movements: ____________ Start time: ____________ Finish time: ____________ °Date: ____________ Movements: ____________ Start time: ____________ Finish time: ____________ °Date: ____________ Movements: ____________ Start time: ____________ Finish time: ____________ °Date: ____________ Movements: ____________ Start time: ____________ Finish time: ____________ °Date: ____________ Movements: ____________ Start time:  ____________ Finish time: ____________ °Date: ____________ Movements: ____________   Start time: ____________ Finish time: ____________  °Date: ____________ Movements: ____________ Start time: ____________ Finish time: ____________ °Date: ____________ Movements: ____________ Start time: ____________ Finish time: ____________ °Date: ____________ Movements: ____________ Start time: ____________ Finish time: ____________ °Date: ____________ Movements: ____________ Start time: ____________ Finish time: ____________ °Date: ____________ Movements: ____________ Start time: ____________ Finish time: ____________ °Date: ____________ Movements: ____________ Start time: ____________ Finish time: ____________ °Date: ____________ Movements: ____________ Start time: ____________ Finish time: ____________  °Date: ____________ Movements: ____________ Start time: ____________ Finish time: ____________ °Date: ____________ Movements: ____________ Start time: ____________ Finish time: ____________ °Date: ____________ Movements: ____________ Start time: ____________ Finish time: ____________ °Date: ____________ Movements: ____________ Start time: ____________ Finish time: ____________ °Date: ____________ Movements: ____________ Start time: ____________ Finish time: ____________ °Date: ____________ Movements: ____________ Start time: ____________ Finish time: ____________ °Date: ____________ Movements: ____________ Start time: ____________ Finish time: ____________  °Date: ____________ Movements: ____________ Start time: ____________ Finish time: ____________ °Date: ____________ Movements: ____________ Start time: ____________ Finish time: ____________ °Date: ____________ Movements: ____________ Start time: ____________ Finish time: ____________ °Date: ____________ Movements: ____________ Start time: ____________ Finish time: ____________ °Date: ____________ Movements: ____________ Start time: ____________ Finish time: ____________ °Date:  ____________ Movements: ____________ Start time: ____________ Finish time: ____________ °Date: ____________ Movements: ____________ Start time: ____________ Finish time: ____________  °Date: ____________ Movements: ____________ Start time: ____________ Finish time: ____________ °Date: ____________ Movements: ____________ Start time: ____________ Finish time: ____________ °Date: ____________ Movements: ____________ Start time: ____________ Finish time: ____________ °Date: ____________ Movements: ____________ Start time: ____________ Finish time: ____________ °Date: ____________ Movements: ____________ Start time: ____________ Finish time: ____________ °Date: ____________ Movements: ____________ Start time: ____________ Finish time: ____________ °Document Released: 02/23/2006 Document Revised: 06/10/2013 Document Reviewed: 11/21/2011 °ExitCare® Patient Information ©2015 [employer]. This information is not intended to replace advice given to you by your health care provider. Make sure you discuss any questions you have with your health care provider. ° °

## 2014-06-15 NOTE — MAU Note (Signed)
Pt presents stating that her midwife called and wanted her to come to the hospital to be evaluated. Scheduled for induction on Tuesday 5/10. Membranes stripped this past Tuesday and has caused bloody show. Denies leaking of fluid. States the baby has been moving less than normal over the last 24 hours. Denies pain.

## 2014-06-15 NOTE — MAU Provider Note (Signed)
History     CSN: [id number]  Arrival date and time: 06/15/14 1103   None     Chief Complaint  Patient presents with  . Decreased Fetal Movement   HPI Comments: [id number] @41 .5 wks here for decreased FM x2 days. +FM this am. No LOF or VB. Irregular BH ctx. Pregnancy complicated by post dates, AMA, GBS positive, hx LEEP w/stable cervical lengths, and seizure dz stable on meds-remote sz activity (6 yrs ago). Planning water immersion and birth.   OB History    Gravida Para Term Preterm AB TAB SAB Ectopic Multiple Living   4 1 1  0 2 0 0 0 0 1      Past Medical History  Diagnosis Date  . CIN III with severe dysplasia   . Epilepsy   . Anxiety   . 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 ASCUS favor benign 04/2013    Ascus Pap smear negative high-risk HPV, negative ECC recommend followup Pap/HPV one year    Past Surgical History  Procedure Laterality Date  . Cervical cone biopsy  2006, 2008    X 2    Family History  Problem Relation Age of Onset  . Heart disease Father   . Cancer Father     Prostate  . Breast cancer Maternal Grandmother [id number]  . COPD Paternal Grandfather     History  Substance Use Topics  . Smoking status: Former Games developermoker  . Smokeless tobacco: Never Used  . Alcohol Use: 2.5 oz/week    5 Standard drinks or equivalent per week     Comment: social    Allergies:  Allergies  Allergen Reactions  . Imiquimod     aldara - burning  . Levetiracetam Other (See Comments)    Depression    Prescriptions prior to admission  Medication Sig Dispense Refill Last Dose  . LamoTRIgine 100 MG TB24 Take 300 mg by mouth daily.   06/14/2014 at Unknown time  . Prenatal Vit-Fe Fumarate-FA (PRENATAL MULTIVITAMIN) TABS tablet Take 3 tablets by mouth daily at 12 noon.   06/14/2014 at Unknown time  . ranitidine (ZANTAC) 150 MG tablet Take 150 mg by mouth 2 (two) times daily.   Past Week at Unknown time    Review of Systems   Constitutional: Negative.   HENT: Negative.   Eyes: Negative.   Respiratory: Negative.   Cardiovascular: Negative.   Gastrointestinal: Negative.   Genitourinary: Negative.   Musculoskeletal: Negative.   Skin: Negative.   Neurological: Negative.   Endo/Heme/Allergies: Negative.   Psychiatric/Behavioral: Negative.    Physical Exam   Blood pressure 112/69, pulse 88, temperature 98.1 F (36.7 C), temperature source Oral, resp. rate 18.  Physical Exam  Constitutional: She is oriented to person, place, and time. She appears well-developed and well-nourished.  HENT:  Head: Normocephalic and atraumatic.  Neck: Normal range of motion. Neck supple.  Cardiovascular: Normal rate.   Respiratory: Effort normal.  GI: Soft.  gravid  Genitourinary: Vagina normal.  SVE 3/80/-2, vtx Membrane sweep  Musculoskeletal: Normal range of motion.  Neurological: She is alert and oriented to person, place, and time.  Skin: Skin is warm and dry.  Psychiatric: She has a normal mood and affect.  Mildly anxious/teary  EFM: 135 bpm, mod variability, +accels, no decels Toco: none  MAU Course  Procedures  NST  Assessment and Plan  41.[redacted] weeks gestation Decreased fetal movement Reactive NST  Discussed post date pregnancy, aging placenta and  increased risk for poor neonatal outcomes, options include-1) admit for IOL w/AROM and possibly Pitocin-may not have option of water immersion or 2)expectant management until 42 wks (Tuesday) with membrane sweep today-may not facilitate labor and would then recommend IOL in 2 days (already scheduled). Pt strongly desires water immersion/birth and natural labor (past negative experience with epidural), therefore would prefer expectant mngt for now-consents to membrane sweep today Discharge home [facility] IC if desires, may facilitate continuation of prostaglandin effect Keep IOL appt as scheduled on 06/17/14 @0730    Latham Kinzler,  Draxton Luu, N 06/15/2014, 12:13 PM

## 2014-06-16 ENCOUNTER — Inpatient Hospital Stay (HOSPITAL_COMMUNITY): Payer: Self-pay

## 2014-06-16 ENCOUNTER — Encounter (HOSPITAL_COMMUNITY): Payer: Self-pay | Admitting: *Deleted

## 2014-06-16 ENCOUNTER — Telehealth (HOSPITAL_COMMUNITY): Payer: Self-pay | Admitting: *Deleted

## 2014-06-16 NOTE — Telephone Encounter (Signed)
Preadmission screen  

## 2014-06-17 ENCOUNTER — Encounter (HOSPITAL_COMMUNITY): Payer: Self-pay | Admitting: *Deleted

## 2014-06-17 ENCOUNTER — Inpatient Hospital Stay (HOSPITAL_COMMUNITY)
Admission: AD | Admit: 2014-06-17 | Discharge: 2014-06-19 | DRG: 775 | Disposition: A | Discharge status: Home / Self Care | Payer: BLUE CROSS/BLUE SHIELD | Source: Ambulatory Visit | Attending: Obstetrics | Admitting: Obstetrics

## 2014-06-17 ENCOUNTER — Inpatient Hospital Stay (HOSPITAL_COMMUNITY): Admission: RE | Admit: 2014-06-17 | Payer: BLUE CROSS/BLUE SHIELD | Source: Ambulatory Visit

## 2014-06-17 DIAGNOSIS — O480 Post-term pregnancy: Secondary | ICD-10-CM | POA: Diagnosis present

## 2014-06-17 DIAGNOSIS — O99824 Streptococcus B carrier state complicating childbirth: Secondary | ICD-10-CM | POA: Diagnosis present

## 2014-06-17 DIAGNOSIS — Z3A42 42 weeks gestation of pregnancy: Secondary | ICD-10-CM | POA: Diagnosis present

## 2014-06-17 DIAGNOSIS — Z3483 Encounter for supervision of other normal pregnancy, third trimester: Secondary | ICD-10-CM | POA: Diagnosis present

## 2014-06-17 LAB — CBC
HCT: 38.6 % (ref 36.0–46.0)
Hemoglobin: 13.4 g/dL (ref 12.0–15.0)
MCH: 30.9 pg (ref 26.0–34.0)
MCHC: 34.7 g/dL (ref 30.0–36.0)
MCV: 89.1 fL (ref 78.0–100.0)
Platelets: 234 10*3/uL (ref 150–400)
RBC: 4.33 MIL/uL (ref 3.87–5.11)
RDW: 12.8 % (ref 11.5–15.5)
WBC: 17.6 10*3/uL — ABNORMAL HIGH (ref 4.0–10.5)

## 2014-06-17 LAB — RPR: RPR Ser Ql: NONREACTIVE

## 2014-06-17 MED ORDER — BENZOCAINE-MENTHOL 20-0.5 % EX AERO
1.0000 "application " | INHALATION_SPRAY | CUTANEOUS | Status: DC | PRN
  Filled 2014-06-17: qty 56

## 2014-06-17 MED ORDER — OXYCODONE-ACETAMINOPHEN 5-325 MG PO TABS: 2.0000 | ORAL_TABLET | ORAL | Status: DC | PRN

## 2014-06-17 MED ORDER — OXYTOCIN 40 UNITS IN LACTATED RINGERS INFUSION - SIMPLE MED
62.5000 mL/h | INTRAVENOUS | Status: DC
Start: 2014-06-17 — End: 2014-06-17

## 2014-06-17 MED ORDER — ACETAMINOPHEN 325 MG PO TABS: 650.0000 mg | ORAL_TABLET | ORAL | Status: DC | PRN

## 2014-06-17 MED ORDER — LAMOTRIGINE ER 100 MG PO TB24
300.0000 mg | ORAL_TABLET | Freq: Every day | ORAL | Status: DC
  Filled 2014-06-17: qty 3

## 2014-06-17 MED ORDER — LIDOCAINE HCL (PF) 1 % IJ SOLN
INTRAMUSCULAR | Status: AC
  Filled 2014-06-17: qty 30

## 2014-06-17 MED ORDER — SODIUM CHLORIDE 0.9 % IJ SOLN: 3.0000 mL | Freq: Two times a day (BID) | INTRAMUSCULAR | Status: DC

## 2014-06-17 MED ORDER — LAMOTRIGINE ER 100 MG PO TB24: 300.0000 mg | ORAL_TABLET | Freq: Every day | ORAL | Status: DC

## 2014-06-17 MED ORDER — DIBUCAINE 1 % RE OINT: 1.0000 "application " | TOPICAL_OINTMENT | RECTAL | Status: DC | PRN

## 2014-06-17 MED ORDER — ONDANSETRON HCL 4 MG/2ML IJ SOLN: 4.0000 mg | Freq: Four times a day (QID) | INTRAMUSCULAR | Status: DC | PRN

## 2014-06-17 MED ORDER — LANOLIN HYDROUS EX OINT: TOPICAL_OINTMENT | CUTANEOUS | Status: DC | PRN

## 2014-06-17 MED ORDER — DIPHENHYDRAMINE HCL 50 MG/ML IJ SOLN: 12.5000 mg | INTRAMUSCULAR | Status: DC | PRN

## 2014-06-17 MED ORDER — PHENYLEPHRINE 40 MCG/ML (10ML) SYRINGE FOR IV PUSH (FOR BLOOD PRESSURE SUPPORT)
80.0000 ug | PREFILLED_SYRINGE | INTRAVENOUS | Status: DC | PRN
  Filled 2014-06-17: qty 2

## 2014-06-17 MED ORDER — OXYTOCIN 10 UNIT/ML IJ SOLN
INTRAMUSCULAR | Status: AC
  Filled 2014-06-17: qty 1

## 2014-06-17 MED ORDER — OXYCODONE-ACETAMINOPHEN 5-325 MG PO TABS: 1.0000 | ORAL_TABLET | ORAL | Status: DC | PRN

## 2014-06-17 MED ORDER — SENNOSIDES-DOCUSATE SODIUM 8.6-50 MG PO TABS
2.0000 | ORAL_TABLET | ORAL | Status: DC
  Administered 2014-06-17 – 2014-06-19 (×2): 2 via ORAL
  Filled 2014-06-17 (×2): qty 2

## 2014-06-17 MED ORDER — LACTATED RINGERS IV SOLN
500.0000 mL | INTRAVENOUS | Status: DC | PRN
  Administered 2014-06-17: 300 mL via INTRAVENOUS

## 2014-06-17 MED ORDER — SODIUM CHLORIDE 0.9 % IV SOLN: 250.0000 mL | INTRAVENOUS | Status: DC | PRN

## 2014-06-17 MED ORDER — SODIUM CHLORIDE 0.9 % IJ SOLN: 3.0000 mL | INTRAMUSCULAR | Status: DC | PRN

## 2014-06-17 MED ORDER — EPHEDRINE 5 MG/ML INJ
10.0000 mg | INTRAVENOUS | Status: DC | PRN
  Filled 2014-06-17: qty 2

## 2014-06-17 MED ORDER — LIDOCAINE HCL (PF) 1 % IJ SOLN
30.0000 mL | INTRAMUSCULAR | Status: AC | PRN
  Administered 2014-06-17: 30 mL via SUBCUTANEOUS
  Filled 2014-06-17: qty 30

## 2014-06-17 MED ORDER — LAMOTRIGINE ER 100 MG PO TB24
300.0000 mg | ORAL_TABLET | Freq: Every day | ORAL | Status: DC
  Administered 2014-06-17 – 2014-06-18 (×2): 300 mg via ORAL
  Filled 2014-06-17: qty 3

## 2014-06-17 MED ORDER — WITCH HAZEL-GLYCERIN EX PADS: 1.0000 "application " | MEDICATED_PAD | CUTANEOUS | Status: DC | PRN

## 2014-06-17 MED ORDER — FAMOTIDINE 20 MG PO TABS
20.0000 mg | ORAL_TABLET | Freq: Every day | ORAL | Status: DC
  Administered 2014-06-17: 20 mg via ORAL
  Filled 2014-06-17 (×3): qty 1

## 2014-06-17 MED ORDER — NALBUPHINE HCL 10 MG/ML IJ SOLN: 5.0000 mg | INTRAMUSCULAR | Status: DC | PRN

## 2014-06-17 MED ORDER — OXYTOCIN BOLUS FROM INFUSION
500.0000 mL | INTRAVENOUS | Status: DC
Start: 2014-06-17 — End: 2014-06-17

## 2014-06-17 MED ORDER — SODIUM CHLORIDE 0.9 % IV SOLN
2.0000 g | Freq: Once | INTRAVENOUS | Status: DC
  Filled 2014-06-17: qty 2000

## 2014-06-17 MED ORDER — IBUPROFEN 600 MG PO TABS
600.0000 mg | ORAL_TABLET | Freq: Four times a day (QID) | ORAL | Status: DC
  Administered 2014-06-17 – 2014-06-19 (×10): 600 mg via ORAL
  Filled 2014-06-17 (×10): qty 1

## 2014-06-17 MED ORDER — FENTANYL 2.5 MCG/ML BUPIVACAINE 1/10 % EPIDURAL INFUSION (WH - ANES): 14.0000 mL/h | INTRAMUSCULAR | Status: DC | PRN

## 2014-06-17 MED ORDER — OXYCODONE-ACETAMINOPHEN 5-325 MG PO TABS
1.0000 | ORAL_TABLET | ORAL | Status: DC | PRN
  Administered 2014-06-17: 1 via ORAL
  Filled 2014-06-17: qty 1

## 2014-06-17 MED ORDER — CITRIC ACID-SODIUM CITRATE 334-500 MG/5ML PO SOLN: 30.0000 mL | ORAL | Status: DC | PRN

## 2014-06-17 NOTE — Progress Notes (Signed)
Interval Note:  S: Feels tired. Pain controlled.  O: VSS  A: S/p SVD-waterbirth  P: PPD #0     Continue routine postpartum care.     Desires early discharge tomorrow.

## 2014-06-17 NOTE — H&P (Signed)
OB ADMISSION/ HISTORY & PHYSICAL:  Admission Date: 06/17/2014  1:02 AM  Admit Diagnosis: 42 weeks / active labor / positive GBS  Shirley Stephenson is a 34 y.o. female presenting for onset of labor at [address] with progression into active labor ~ midnight. Intense contractions - requesting to enter tub ASAP. Laboring in hands-knees /  Kneeling on floor / leaning over bed.  Prenatal History: [id number]   EDC : 06/03/2014, Date entered prior to episode creation  [facility]  Primary Ob Provider: Raelyn Moraolitta Dawson CNM Prenatal course complicated by hx abnormal pap (hx CIN III) with conization x 2 / AMA / seizure disorder stable on medications (last seizure 2010) / post-dates / vulvar condylomata  Prenatal Labs: ABO, Rh: A/Negative/-- (12/18 0000) Antibody: Negative (12/18 0000) Rubella: Nonimmune (12/18 0000)  RPR: Nonreactive (12/18 0000)  HBsAg: Negative (12/18 0000)  HIV: Non-reactive (12/18 0000)  GTT: normal GBS: Positive (04/04 0000)   Medical / Surgical History :  Past medical history:  Past Medical History  Diagnosis Date  . CIN III with severe dysplasia   . Anxiety   . 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 ASCUS favor benign 04/2013    Ascus Pap smear negative high-risk HPV, negative ECC recommend followup Pap/HPV one year  . Vaginal Pap smear, abnormal   . Epilepsy     last seizure 02/2008     Past surgical history:  Past Surgical History  Procedure Laterality Date  . Cervical cone biopsy  2006, 2008    X 2    Family History:  Family History  Problem Relation Age of Onset  . Heart disease Father   . Cancer Father     Prostate, thyroid  . Breast cancer Maternal Grandmother [phone]  . COPD Paternal Grandfather   . Asthma Brother      Social History:  reports that she quit smoking about 4 years ago. She has never used smokeless tobacco. She reports that she drinks about 2.5 oz of alcohol  per week. She reports that she does not use illicit drugs.   Allergies: Imiquimod and Levetiracetam    Current Medications at time of admission:  Prior to Admission medications   Medication Sig Start Date End Date Taking? Authorizing Provider  LamoTRIgine 100 MG TB24 Take 300 mg by mouth daily.    Historical Provider, MD  Prenatal Vit-Fe Fumarate-FA (PRENATAL MULTIVITAMIN) TABS tablet Take 3 tablets by mouth daily at 12 noon.    Historical Provider, MD  ranitidine (ZANTAC) 150 MG tablet Take 150 mg by mouth 2 (two) times daily.    Historical Provider, MD   Review of Systems: active FM onset of ctx @ 1845 now every 2-5 minutes bloody show present wants water immersion / water birth  Physical Exam:  VS: Blood pressure 121/74, pulse 89, height 5\' 9"  (1.753 m), weight 90.719 kg (200 lb).  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   FHR reviewed : baseline rate 145 / variability moderate / accelerations +  loss of tracing with maternal movement - possible variable decels versus maternal pulse versus artifact TOCO: 3-4  Assessment: [redacted] weeks gestation + GBS carrier active stage of labor with plan for water birth FHR category 1   Plan:  Admit continuous CNM labor presence in labor water immersion after FHR reassessed in BS to ensure no repetitive decelerations Ampicillin for GBS (+) AROM after ABX - assess for MSF prior to tub entry  Dr Ernestina Pennafogleman notified of admission / plan of care   Marlinda MikeBAILEY, TANYA CNM, MSN, [facility] 06/17/2014, 1:34 AM

## 2014-06-17 NOTE — Progress Notes (Signed)
IV fell out when pt changed position at 0230, per T Fredric MareBailey CNM pt does not need to have IV replaced for GBS tx d/t imminent delivery.

## 2014-06-17 NOTE — Lactation Note (Signed)
This note was copied from the chart of Shirley Stephenson. Lactation Consultation Note Mom reports baby has been fussy trying to latch and has only maintained latch for a short feeding.  Mom is seeing colosotrum on baby's mouth.   Mom reports attempting cradle hold and encouraged mom to use a position that allows mom to control latching baby so she will learn.  Demonstrated use of hand pump to help evert mom's large nipples.  Attempted cross cradle hold and encouraged mom to relax shoulders during feedings.  Baby latches and only takes a few sucks and stops.  Assisted with football hold.  Baby latches well and maintained good strong jaw movements for about 6 minutes. Encouraged mom to stimulate baby during feeding if she is sleepy.  Mom denies pain or concerns at this time.  [facility] given and discussed.  Encouraged to feed with early cues on demand.  Early newborn behavior discussed.  Hand expression demonstrated with colostrum visible.  Mom to call for assist as needed.     Patient Name: Shirley Stephenson JXBJY'NToday's Date: 06/17/2014 Reason for consult: Initial assessment   Maternal Data Has patient been taught Hand Expression?: Yes Does the patient have breastfeeding experience prior to this delivery?: Yes  Feeding Feeding Type: Breast Fed Length of feed:  (observed about 6 mintues)  LATCH Score/Interventions Latch: Grasps breast easily, tongue down, lips flanged, rhythmical sucking. Intervention(s): Skin to skin;Teach feeding cues;Waking techniques Intervention(s): Breast compression;Breast massage;Assist with latch;Adjust position  Audible Swallowing: A few with stimulation (good jaw movement) Intervention(s): Skin to skin;Hand expression  Type of Nipple: Everted at rest and after stimulation  Comfort (Breast/Nipple): Soft / non-tender     Hold (Positioning): Assistance needed to correctly position infant at breast and maintain latch. Intervention(s): Skin to skin;Position  options;Support Pillows;Breastfeeding basics reviewed  LATCH Score: 8  Lactation Tools Discussed/Used     Consult Status Consult Status: Follow-up Date: 06/18/14 Follow-up type: In-patient    Shirley RodneyShoptaw, Shirley Stephenson 06/17/2014, 6:15 PM

## 2014-06-18 MED ORDER — RHO D IMMUNE GLOBULIN 1500 UNIT/2ML IJ SOSY
300.0000 ug | PREFILLED_SYRINGE | Freq: Once | INTRAMUSCULAR | Status: AC
  Administered 2014-06-18: 300 ug via INTRAMUSCULAR
  Filled 2014-06-18: qty 2

## 2014-06-18 NOTE — Progress Notes (Signed)
Patient ID: Shirley Stephenson, female   DOB: 09/20/1980, 34 y.o.   MRN: [id number] PPD # 1 SVD  S:  Reports feeling well             Tolerating po/ No nausea or vomiting             Bleeding is spotting             Pain controlled with ibuprofen (OTC)             Up ad lib / ambulatory / voiding without difficulties    Newborn  Information for the patient's newborn:  Sakiyah, Shur [[id number]]  female breast feeding - having difficulties staying latched on / infant gets very fussy after a couple of sucks / 6% wt loss at this time / lactation consultants to assist / No difficulty pumping - infant taking milk from bottle with no diffculties   O:  A & O x 3, in no apparent distress              VS:  Filed Vitals:   06/17/14 0650 06/17/14 1214 06/17/14 1740 06/18/14 0620  BP: 108/55 99/55 114/72 95/61  Pulse: 93 73 75 67  Temp: 98.9 F (37.2 C) 98.1 F (36.7 C) 97.9 F (36.6 C) 98.2 F (36.8 C)  TempSrc: Oral Oral Oral Oral  Resp: _0 Height:      Weight:      SpO2: 98%       LABS:  Recent Labs  06/17/14 0210  WBC 17.6*  HGB 13.4  HCT 38.6  PLT 234    Blood type: A NEG (05/10 0210) / Infant Rh Pos - Rhophylac indicated  Rubella: Nonimmune (12/18 0000) - MMR required  I&O: I/O last 3 completed shifts: In: -  Out: 1 [Urine:1]             Lungs: Clear and unlabored  Heart: regular rate and rhythm / no murmurs  Abdomen: soft, non-tender, non-distended              Fundus: firm, non-tender, U-1  Perineum: 1st degree repair healing well, no edema  Lochia: minimal  Extremities: no edema, no calf pain or tenderness, no Homans    A/P: PPD # 1  34 y.o., [id number]   Principal Problem:    Postpartum care following vaginal delivery-waterbirth (5/10)  Active Problems:    Normal labor and delivery   Doing well - stable status  Routine post partum orders  D/C home tomorrow     Laury Deep, M, MSN, CNM 06/18/2014, 8:35 AM

## 2014-06-18 NOTE — Lactation Note (Signed)
This note was copied from the chart of Girl Courtney HeysCaitlin Behne. Lactation Consultation Note  Patient Name: Girl Courtney HeysCaitlin Grimsley WUJWJ'XToday's Date: 06/18/2014  baby is now 7337 hrs old and has been latching on the right breast consistently . Baby recently fed  From 335 to 4 pm for 25 mins. , LC observed baby already latched by mom with depth and adequate support.  Noted multiply swallows, increased with breast compressions. When baby released nipple was well rounded.  Baby asleep after feeding. LC [facility] the [facility] for now and wrote it on the dry erase board in steps.  DEBP had already been set up and per mom was able to use it. LC also fit mom for inverted shells to assist  With reverse pressure. Mom also has comfort gels for tender nipples. LC recommended to drink water due to generalized edema. Steps for latching - 1st breast - ( right - breast massage, hand express, pre-pump with hand pump , latch with firm support and breast compressions, For now due to edema just pump left breast.    Maternal Data    Feeding Feeding Type: Breast Fed Length of feed: 25 min (LC obs, latch after the baby had latched with depth,swallows)  LATCH Score/Interventions Latch:  (latched  with depth ) Intervention(s): Skin to skin;Teach feeding cues;Waking techniques  Audible Swallowing:  (increased with breast compressions )  Type of Nipple:  (base of areola still has some edema )  Comfort (Breast/Nipple):  (per mom initially tender per mom improves )  Problem noted: Mild/Moderate discomfort Interventions (Mild/moderate discomfort): Comfort gels  Hold (Positioning):  (independent with latch )  LATCH Score: 8  Lactation Tools Discussed/Used     Consult Status Consult Status: Follow-up Date: 06/19/14 Follow-up type: In-patient    Kathrin Greathouseorio, Ekam Bonebrake Ann 06/18/2014, 4:30 PM

## 2014-06-18 NOTE — Lactation Note (Signed)
This note was copied from the chart of Shirley Stephenson. Lactation Consultation Note  Patient Name: Shirley Stephenson WUJWJ'XToday's Date: 06/18/2014 Reason for consult: Follow-up assessment  LC assisted mom to latch on the left, breast tissue challenging with swelling at the base of the nipple And areola complex. Prior to latch - breast massage, hand express, pre-pump to make the base of the nipple  And areola more elastic , improves it slightly. Attempted latch , and the LC sized mom for a #24 NS , and attempted to latch  And baby had a difficult time obtaining depth. Baby ended up falling asleep , and mom laid her on her chest , not showing feeding cues.  LC recommended at this point due to the challenging breast tissue on the left side to just pump after feeding on the right and when she starts Getting EBM to [facility] or [facility] assist to instill EBM into the top of the NS to give the baby incentive to latch and feed. As LC was finishing mom was getting family company.    Maternal Data Has patient been taught Hand Expression?: Yes  Feeding Feeding Type: Breast Fed Length of feed: 0 min (no latch sustained; LC called for assessment)  LATCH Score/Interventions Latch: Repeated attempts needed to sustain latch, nipple held in mouth throughout feeding, stimulation needed to elicit sucking reflex. Intervention(s): Skin to skin;Teach feeding cues;Waking techniques Intervention(s): Adjust position;Assist with latch;Breast massage;Breast compression  Audible Swallowing: None Intervention(s): Skin to skin;Hand expression  Type of Nipple: Everted at rest and after stimulation (large nipple , semi compressible areola )  Comfort (Breast/Nipple): Soft / non-tender  Problem noted: Mild/Moderate discomfort Interventions (Mild/moderate discomfort): Comfort gels  Hold (Positioning): Assistance needed to correctly position infant at breast and maintain latch. Intervention(s): Breastfeeding basics  reviewed;Support Pillows;Position options;Skin to skin  LATCH Score: 6  Lactation Tools Discussed/Used Tools: Nipple Dorris CarnesShields;Pump (1st attempted with skin to skin , and attempted with #24 NS , no depth ) Nipple shield size: 24 Breast pump type: Double-Electric Breast Pump (already set up by [facility] . also hand pump )   Consult Status Consult Status: Follow-up Date: 06/18/14 Follow-up type: In-patient    Kathrin Greathouseorio, Valerye Kobus Ann 06/18/2014, 12:18 PM

## 2014-06-18 NOTE — Progress Notes (Signed)
  CLINICAL SOCIAL WORK MATERNAL/CHILD NOTE  Patient Details  Name: Shirley Courtney HeysCaitlin Tvedt MRN: [id number] Date of Birth: 06/17/2016  Date:  06/18/2014  Clinical Social Worker Initiating Note:  Loleta BooksSarah Sondi Desch, LCSW Date/ Time Initiated:  06/18/14/0945     Child's Name:  Shirley Stephenson   Legal Guardian:  Luther Parodyaitlin and Clayburn PertEvan (parents)  Need for Interpreter:  None   Date of Referral:  06/18/14     Reason for Referral:  History of depression, anxiety, and postpartum depression  [facility]   Address:  [address] [place], [place] [address]  Phone number:  [phone]   Household Members:  Spouse, Minor Children (36 years old)   Natural Supports (not living in the home):  Immediate Family, Extended Family   Professional Supports:   None reported  Employment: Full-time   Type of Work: Producer, television/film/videoHair dresser   Education:    N/A  Architectinancial Resources:  Media plannerrivate Insurance   Other Resources:    None identified  Cultural/Religious Considerations Which May Impact Care: None reported  Strengths:  Ability to meet basic needs , Home prepared for child , Pediatrician chosen    Risk Factors/Current Problems:   1)Mental Health Concerns- MOB presents with chronic history of depression and anxiety. She also endorsed symptoms of significant postpartum depression after her first child was born 15 years ago.  MOB denied any acute mental health symptoms during this pregnancy.   Cognitive State:  Alert , Able to Concentrate , Goal Oriented , Linear Thinking , Insightful    Mood/Affect:  Animated, Happy , Interested    CSW Assessment:  MOB and FOB presented as easily engaged and receptive to the visit. The MOB expressed appreciation for the visit after CSW explained reason for consult.  MOB denied current mental health concerns, and shared that she felt "great" during the pregnancy.  MOB acknowledged chronic history of depression/anxiety, but did not specify or elaborate on her history.  She also acknowledged history of postpartum depression, and reported that it was "bad".  She stated that it was 15 years ago, and shared that her life situation is significantly different than it was 15 years ago.    CSW reviewed safety and protective factors for postpartum depression.  MOB endorsed low stress in life and strong support from her family, friends, and her employer.  She stated that she is excited about the infant's arrival, and is looking forward to her transition home.  MOB shared that she feels comfortable accessing help if needed, and stated that she and the FOB feel confident and able to monitor her mood in the upcoming weeks.  MOB agreed to contact her OB if she notes any symptoms.    MOB denied additional questions, concerns, or needs at this time. She agreed to contact CSW if needs arise.   CSW Plan/Description:   1) Patient/Family Education: Postpartum depression 2)No Further Intervention Required/No Barriers to Discharge    Kelby FamVenning, Leland Staszewski N, LCSW 06/18/2014, 10:19 AM

## 2014-06-19 LAB — RH IG WORKUP (INCLUDES ABO/RH)
ABO/RH(D): A NEG
Fetal Screen: NEGATIVE
GESTATIONAL AGE(WKS): 42
Unit division: 0

## 2014-06-19 MED ORDER — IBUPROFEN 600 MG PO TABS: 600.0000 mg | ORAL_TABLET | Freq: Four times a day (QID) | ORAL | Status: AC

## 2014-06-19 NOTE — Lactation Note (Signed)
This note was copied from the chart of Shirley Stephenson [place]. Lactation Consultation Note; Mom reports breast feeding is going much better today. Has been wearing shells and reports they are helping a lot. Mom getting ready to latch baby as I went into room. Breasts are much fuller today. Baby latched easily and mom reports no pain. Reviewed engorgement prevention and treatment. Has DEBP at home- does not remember what brand. Baby still nursing as I left room. No questions at present. Reviewed BFSG and OP appointments as resources after DC. To call prn  Patient Name: Shirley Stephenson Teichert NFAOZ'HToday's Date: 06/19/2014 Reason for consult: Follow-up assessment   Maternal Data Formula Feeding for Exclusion: No Has patient been taught Hand Expression?: Yes Does the patient have breastfeeding experience prior to this delivery?: Yes  Feeding Feeding Type: Breast Fed Length of feed: 35 min  LATCH Score/Interventions Latch: Grasps breast easily, tongue down, lips flanged, rhythmical sucking.  Audible Swallowing: A few with stimulation  Type of Nipple: Everted at rest and after stimulation Intervention(s): Shells  Comfort (Breast/Nipple): Filling, red/small blisters or bruises, mild/mod discomfort  Problem noted: Filling Interventions (Filling): Massage;Frequent nursing Interventions (Mild/moderate discomfort): Hand expression;Pre-pump if needed;Comfort gels;Post-pump  Hold (Positioning): No assistance needed to correctly position infant at breast. Intervention(s): Breastfeeding basics reviewed  LATCH Score: 8  Lactation Tools Discussed/Used     Consult Status Consult Status: Complete    Pamelia HoitWeeks, Kinnley Paulson D 06/19/2014, 10:07 AM

## 2014-06-19 NOTE — Lactation Note (Signed)
This note was copied from the chart of Shirley Courtney HeysCaitlin Sequeira. Lactation Consultation Note  Assisted mom with latching baby to full breast.  Review on reverse pressure softening.  Patient Name: Shirley Stephenson [name]: 06/19/2014     Maternal Data    Feeding Feeding Type: Breast Fed Length of feed: 15 min  LATCH Score/Interventions Latch: Repeated attempts needed to sustain latch, nipple held in mouth throughout feeding, stimulation needed to elicit sucking reflex. (full breast)  Audible Swallowing: Spontaneous and intermittent Intervention(s): Skin to skin  Type of Nipple: Everted at rest and after stimulation  Comfort (Breast/Nipple): Soft / non-tender     Hold (Positioning): Assistance needed to correctly position infant at breast and maintain latch.  LATCH Score: 8  Lactation Tools Discussed/Used     Consult Status      Shirley Stephenson, Shirley Stephenson 06/19/2014, 2:51 PM

## 2014-06-19 NOTE — Progress Notes (Signed)
PPD 2 SVD  S:  Reports feeling well - better than expected             Tolerating po/ No nausea or vomiting             Bleeding is light             Pain controlled with motrin only             Up ad lib / ambulatory / voiding QS  Newborn breast feeding   O:               VS: BP 112/75 mmHg  Pulse 67  Temp(Src) 98 F (36.7 C) (Oral)  Resp 18  Ht 5\' 9"  (1.753 m)  Wt 90.719 kg (200 lb)  BMI 29.52 kg/m2  SpO2 98%  LMP  (LMP Unknown)  Breastfeeding? Unknown   LABS:              Recent Labs  06/17/14 0210  WBC 17.6*  HGB 13.4  PLT 234               Blood type: --/--/A NEG (05/11 0650)  Rubella: Nonimmune (12/18 0000)                     I&O: Intake/Output    None                 Physical Exam:             Alert and oriented X3  Lungs: Clear and unlabored  Heart: regular rate and rhythm / no mumurs  Abdomen: soft, non-tender, non-distended              Fundus: firm, non-tender, U-1  Perineum: no edema  Lochia: light   Extremities: no edema, no calf pain or tenderness    A: PPD # 2 WB  Doing well - stable status  P: Routine post partum orders  DC home  Marlinda MikeBAILEY, Vaidehi Braddy CNM, MSN, [facility] 06/19/2014, 9:29 AM

## 2014-06-19 NOTE — Discharge Summary (Signed)
Obstetric Discharge Summary  Reason for Admission: onset of labor Prenatal Procedures: NST and ultrasound Intrapartum Procedures: spontaneous vaginal delivery Postpartum Procedures: Rho(D) Ig Complications-Operative and Postpartum: 1st degree perineal laceration HEMOGLOBIN  Date Value Ref Range Status  06/17/2014 13.4 12.0 - 15.0 g/dL Final   HCT  Date Value Ref Range Status  06/17/2014 38.6 36.0 - 46.0 % Final    Physical Exam:  General: alert, cooperative and no distress Lochia: appropriate Uterine Fundus: firm Incision: healing well DVT Evaluation: No evidence of DVT seen on physical exam.  Discharge Diagnoses: Term Pregnancy-delivered  Discharge Information: Date: 06/19/2014 Activity: pelvic rest Diet: routine Medications: PNV, Ibuprofen and lamotrigine Condition: stable Instructions: refer to practice specific booklet Discharge to: home Follow-up Information    Follow up with Raelyn MoraAWSON, ROLITTA, M, CNM. Schedule an appointment as soon as possible for a visit in 6 weeks.   Specialty:  Obstetrics and Gynecology   Contact information:   [address] [place] [address] [phone]       Newborn Data: Live born female  Birth Weight: 6 lb 12 oz (3062 g) APGAR: 9, 9  Home with mother.  Marlinda MikeBAILEY, TANYA 06/19/2014, 9:33 AM

## 2014-06-21 LAB — TYPE AND SCREEN
ABO/RH(D): A NEG
Antibody Screen: POSITIVE
DAT, IgG: NEGATIVE
Unit division: 0
Unit division: 0

## 2014-08-29 ENCOUNTER — Ambulatory Visit (HOSPITAL_COMMUNITY)
Admission: RE | Admit: 2014-08-29 | Discharge: 2014-08-29 | Disposition: A | Discharge status: Home / Self Care | Payer: BLUE CROSS/BLUE SHIELD | Source: Ambulatory Visit | Attending: Gynecology | Admitting: Gynecology

## 2014-08-29 NOTE — Lactation Note (Signed)
Lactation Consult  Mother's reason for visit:  Referral from Dr Hosie Poisson due to poor latch and possible tight frenula Visit Type: feeding assessment  Appointment Notes:  Mother complains of pain scale of #3 when latching. She feels pain the entire feeding. She describes pain on the lower half of her nipples. Mother fed Shirley Stephenson at 8am. She states that she cluster feeds during the day and is very fussy in the evening. Mother also has not been successful at getting Shirley Stephenson to take a bottle. She has to return to work in 1-2 weeks.  Mother states that infant sleeps 4-5 hours at night.  Consult:  Initial Lactation Consultant:  Michel Bickers  ________________________________________________________________________  Shirley Stephenson Name: Shirley Stephenson Date of Birth: 06/17/2016 Pediatrician:Dr Hosie Poisson Gender: female Gestational Age: [redacted]w[redacted]d (At Birth) Birth Weight: 6 lb 12 oz (3062 g) Weight at Discharge: Weight: 6 lb 2.1 oz (2780 g)Date of Discharge: 06/19/2014 [facility] Weights   06/17/14 0307 06/17/14 2320 06/19/14 0020  Weight: 6 lb 12 oz (3062 g) 6 lb 5.1 oz (2865 g) 6 lb 2.1 oz (2780 g)    Weight today: 9-9.3, 4346     Admission Information     Provider Service Admission Date    Aggie Hacker, MD Newborn 06/17/2014          ADT Events       Unit Room Bed Service Event    06/17/14 0307 [facility] [id number] Newborn Admission    06/17/14 0420 [facility] [id number] Newborn Transfer Out    06/17/14 0420 [facility] 9150 [id number] Newborn Transfer In    06/19/14 1510 [facility] 9150 [id number] Newborn Discharge      Weight Information (last 3 days) before discharge     Date/Time   Weight   BSA (Calculated - sq m)   BMI (Calculated) Who      06/19/14 0020  6 lb 2.1 oz (2780 g)  --  -- CI     06/17/14 2320  6 lb 5.1 oz (2865 g)  --  -- CS     06/17/14 0307  6 lb 12 oz (3062 g)  --  -- LC           Weights    Go to now       06/15/14 - Today         One Day Eight Hours  View All    05/10 05/10 05/12    Time:  0307 2320 0020    Weight   Weight  6 lb 1... 6 lb 5... 6 lb 2...  Weight               ________________________________________________________________________  Mother's Name: Shirley Stephenson Type of delivery:  vaginal del Breastfeeding Experience:  3 months  with her first child, who in now 15 yrs  Maternal Medical Conditions:  History post partum depression, for 4 months with first child. She doesn't remember if she took medications. She denies having had any depression with this child.   Maternal Medications: prenatal vits, fenugreek  ________________________________________________________________________  Breastfeeding History (Post Discharge)  Frequency of breastfeeding:  Every 1-3 hours Duration of feeding:  -15-30 mins using one breast per feeding  Mother pumps once daily and obtains 1-3 ounces She has a Spectra 2, she also has a Medela PIS  Infant Intake and Output Assessment  Voids:  6-8 in 24 hrs.  Color:  Clear yellow Stools:  1 in 24 hrs.  Color:  Green and Yellow  ________________________________________________________________________  Maternal  Breast Assessment  Breast:  Full and Piercings Nipple:  Erect and Reddened Pain level:  3 Pain interventions:  Bra and mother is using milk savers  _______________________________________________________________________ Feeding Assessment/Evaluation:   Mother fed Shirley Stephenson one hour before consult. Infant was not hungry when she first arrived. She was very fussy when placed to breast. After a short nap, infant was placed to the breast.  Observed mother allow infant to self latch . She had a very shallow latch with lots of noisy clicking. Assist mother with latching infant with good depth.  Infant bounces on and off during the entire feeding for 10-15 mins.  Mother has a forceful milk ejection reflex. Mother does have pink  nipples with no visible cracking. She states she does have a history of vaginal yeast infections. She doesn't describe classic yeast symptoms.    I feel that she would benefit from an evaluation from Dr Wilhemina Bonito for a possible lingual frenula revision.  Mother has done a great job at feeding infant on cue and has a good milk supply. If supply were not great I think Shirley Stephenson would be transferring less due to tightness on tongue.   Mother is planning a trip next week. She states she will be with her sister who has a 36 month old and plans to assist her with getting Shirley Stephenson to take a bottle.  Mother has minimal anxiety and has a good support system.  Praise and support given to mother.     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 She has fair lateral mobility of her tongue but does cup edges of her tongue with elevation.   Positioning:  Cross cradle Left breast  LATCH documentation:  Latch:  1 = Repeated attempts needed to sustain latch, nipple held in mouth throughout feeding, stimulation needed to elicit sucking reflex.  Audible swallowing:  2 = Spontaneous and intermittent  Type of nipple:  2 = Everted at rest and after stimulation  Comfort (Breast/Nipple):  1 = Filling, red/small blisters or bruises, mild/mod discomfort  Hold (Positioning):  2 = No assistance needed to correctly position infant at breast  LATCH score:  8  Attached assessment:  Deep  Lips flanged:  No.  Lips untucked:  Yes.    Suck assessment:  Nutritive  Pre-feed weight:  4346, 9-9.3 Post-feed weight:  4400, 9-11.2 Amount transferred:  54 ml  Additional Feeding Assessment : Infant latched on much better to alternate breast with minimal assistance flanging lips. I advised mother to offer second breast for additional feeding. Infant transferred 30 ml   Positioning:  Cross cradle Right breast  LATCH documentation:  Latch:  1 =  Repeated attempts needed to sustain latch, nipple held in mouth throughout feeding, stimulation needed to elicit sucking reflex.  Audible swallowing:  2 = Spontaneous and intermittent  Type of nipple:  2 = Everted at rest and after stimulation  Comfort (Breast/Nipple):  1 = Filling, red/small blisters or bruises, mild/mod discomfort  Hold (Positioning):  2 = No assistance needed to correctly position infant at breast  LATCH score:  8  Attached assessment:  Deep  Lips flanged:  No.  Lips untucked:  Yes.    Suck assessment:  Nutritive   Pre-feed weight: 4400, 9-11.2  Post-feed weight:  4430, 9-12.3 Amount transferred:  30 ml   Total amount transferred:  84 ml  Advised mother to use tips learned in consultation for deeper  latch Mother taught how to adjust infants lower jaw and flange upper lip.  Mother to pump 1-2 times daily for 15 mins Advised to offer bottle for one feeding daily and pump afterward Mother to give infant at least 3 ounces of EBM  if using a bottle Mother to continue to follow up with [facility] services as needed, she will call if continued sore nipples Mother was given Dr Cooper Render wedsite and number to follow up for an evaluation for possible revision.  Mother has an appt with Dr Hosie Poisson in Sept for 4 month check up.  Mother is aware of available LC services by phone 7 days a week

## 2015-04-26 ENCOUNTER — Encounter (HOSPITAL_COMMUNITY): Payer: Self-pay | Admitting: *Deleted

## 2015-04-26 ENCOUNTER — Emergency Department (HOSPITAL_COMMUNITY)
Admission: EM | Admit: 2015-04-26 | Discharge: 2015-04-26 | Disposition: A | Discharge status: Home / Self Care | Payer: BLUE CROSS/BLUE SHIELD | Source: Home / Self Care | Attending: Emergency Medicine | Admitting: Emergency Medicine

## 2015-04-26 DIAGNOSIS — J111 Influenza due to unidentified influenza virus with other respiratory manifestations: Secondary | ICD-10-CM

## 2015-04-26 HISTORY — DX: Unspecified convulsions: R56.9

## 2015-04-26 MED ORDER — ACETAMINOPHEN 325 MG PO TABS
975.0000 mg | ORAL_TABLET | Freq: Once | ORAL | Status: AC
  Administered 2015-04-26: 975 mg via ORAL

## 2015-04-26 MED ORDER — OSELTAMIVIR PHOSPHATE 75 MG PO CAPS: 75.0000 mg | ORAL_CAPSULE | Freq: Two times a day (BID) | ORAL | Status: DC

## 2015-04-26 MED ORDER — ACETAMINOPHEN 325 MG PO TABS
ORAL_TABLET | ORAL | Status: AC
  Filled 2015-04-26: qty 3

## 2015-04-26 NOTE — Discharge Instructions (Signed)

## 2015-04-26 NOTE — ED Provider Notes (Signed)
CSN: [id number]     Arrival date & time 04/26/15  1751 History   First MD Initiated Contact with Patient 04/26/15 1902     Chief Complaint  Patient presents with  . Influenza   (Consider location/radiation/quality/duration/timing/severity/associated sxs/prior Treatment) HPI Pt presents with sore throat, body aches, fever, chills for 2 days Home treatment has been OTC meds without much relief of symptoms Fever is improved for short periods of time with OTC antipyretics. Pain score is 4 mostly from coughing and body aches Taking fluids, no appetite flu shot Breast feeding Has been exposed to others with similar symptoms.  Denies: CP, SOB, vomiting or diarrhea.  Past Medical History  Diagnosis Date  . CIN III with severe dysplasia   . Anxiety   . 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 ASCUS favor benign 04/2013    Ascus Pap smear negative high-risk HPV, negative ECC recommend followup Pap/HPV one year  . Vaginal Pap smear, abnormal   . Epilepsy (HCC)     last seizure 02/2008  . Seizures [facility])    Past Surgical History  Procedure Laterality Date  . Cervical cone biopsy  2006, 2008    X 2   Family History  Problem Relation Age of Onset  . Heart disease Father   . Cancer Father     Prostate, thyroid  . Breast cancer Maternal Grandmother 14  . COPD Paternal Grandfather   . Asthma Brother    Social History  Substance Use Topics  . Smoking status: Former Smoker    Quit date: 06/16/2010  . Smokeless tobacco: Never Used  . Alcohol Use: Yes     Comment: occasional   OB History    Gravida Para Term Preterm AB TAB SAB Ectopic Multiple Living   0 2 0 0 0 0 2     Review of Systems See hpi Allergies  Imiquimod and Levetiracetam  Home Medications   Prior to Admission medications   Medication Sig Start Date End Date Taking? Authorizing Provider  LamoTRIgine 100 MG TB24 Take 300 mg by mouth daily.   Yes Historical  Provider, MD  UNABLE TO FIND Med Name: Canila (BCP)   Yes Historical Provider, MD  ibuprofen (ADVIL,MOTRIN) 600 MG tablet Take 1 tablet (600 mg total) by mouth every 6 (six) hours. 06/19/14   Marlinda Mike, CNM  oseltamivir (TAMIFLU) 75 MG capsule Take 1 capsule (75 mg total) by mouth every 12 (twelve) hours. 04/26/15   Tharon Aquas, PA  Prenatal Vit-Fe Fumarate-FA (PRENATAL MULTIVITAMIN) TABS tablet Take 3 tablets by mouth daily at 12 noon.    Historical Provider, MD  ranitidine (ZANTAC) 150 MG tablet Take 150 mg by mouth 2 (two) times daily.    Historical Provider, MD   Meds Ordered and Administered this Visit   Medications  acetaminophen (TYLENOL) tablet 975 mg (975 mg Oral Given 04/26/15 1917)    BP 104/62 mmHg  Pulse 123  Temp(Src) 102.1 F (38.9 C) (Oral)  Resp 18  SpO2 98% No data found.   Physical Exam NURSES NOTES AND VITAL SIGNS REVIEWED. CONSTITUTIONAL: Well developed, well nourished, no acute distress HEENT: normocephalic, atraumatic, right and left TM's are normal EYES: Conjunctiva normal NECK:normal ROM, supple, no adenopathy PULMONARY:No respiratory distress, normal effort, Lungs: CTAb/l, no wheezes, or increased work of breathing CARDIOVASCULAR: RRR, no murmur ABDOMEN: soft, ND, NT, +'ve BS MUSCULOSKELETAL: Normal ROM of all extremities,  SKIN: warm and dry  without rash PSYCHIATRIC: Mood and affect, behavior are normal   ED Course  Procedures (including critical care time)  Labs Review Labs Reviewed - No data to display  Imaging Review No results found.   Visual Acuity Review  Right Eye Distance:   Left Eye Distance:   Bilateral Distance:    Right Eye Near:   Left Eye Near:    Bilateral Near:        RX Tamiflu  Ok for breast feeding moms MDM   1. Flu     Patient is reassured that there are no issues that require transfer to higher level of care at this time or additional tests. Patient is advised to continue home symptomatic  treatment. Patient is advised that if there are new or worsening symptoms to attend the emergency department, contact primary care provider, or return to UC. Instructions of care provided discharged home in stable condition.  Works as Producer, television/film/videohair dresser   THIS NOTE WAS GENERATED USING A VOICE RECOGNITION SOFTWARE PROGRAM. ALL REASONABLE EFFORTS  WERE MADE TO PROOFREAD THIS DOCUMENT FOR ACCURACY.  I have verbally reviewed the discharge instructions with the patient. A printed AVS was given to the patient.  All questions were answered prior to discharge.      Tharon AquasFrank C Patrick, PA 04/26/15 2030

## 2015-04-26 NOTE — ED Notes (Signed)
Started to feel "uneasy" with upset stomach 2 days ago; woke up yesterday with fever, chills, slight cough, slight chest discomfort.  Has been taking IBU 200mg  (last dose @ 1400 today).

## 2015-07-13 DIAGNOSIS — A630 Anogenital (venereal) warts: Secondary | ICD-10-CM | POA: Diagnosis not present

## 2015-07-13 DIAGNOSIS — D2271 Melanocytic nevi of right lower limb, including hip: Secondary | ICD-10-CM | POA: Diagnosis not present

## 2015-07-13 DIAGNOSIS — D225 Melanocytic nevi of trunk: Secondary | ICD-10-CM | POA: Diagnosis not present

## 2015-07-13 DIAGNOSIS — L918 Other hypertrophic disorders of the skin: Secondary | ICD-10-CM | POA: Diagnosis not present

## 2015-07-13 DIAGNOSIS — D239 Other benign neoplasm of skin, unspecified: Secondary | ICD-10-CM | POA: Diagnosis not present

## 2015-09-08 DIAGNOSIS — F411 Generalized anxiety disorder: Secondary | ICD-10-CM | POA: Diagnosis not present

## 2015-09-15 DIAGNOSIS — F411 Generalized anxiety disorder: Secondary | ICD-10-CM | POA: Diagnosis not present

## 2015-10-19 DIAGNOSIS — G40009 Localization-related (focal) (partial) idiopathic epilepsy and epileptic syndromes with seizures of localized onset, not intractable, without status epilepticus: Secondary | ICD-10-CM | POA: Diagnosis not present

## 2015-10-19 DIAGNOSIS — F410 Panic disorder [episodic paroxysmal anxiety] without agoraphobia: Secondary | ICD-10-CM | POA: Diagnosis not present

## 2015-10-19 DIAGNOSIS — G40409 Other generalized epilepsy and epileptic syndromes, not intractable, without status epilepticus: Secondary | ICD-10-CM | POA: Diagnosis not present

## 2015-10-19 DIAGNOSIS — G40209 Localization-related (focal) (partial) symptomatic epilepsy and epileptic syndromes with complex partial seizures, not intractable, without status epilepticus: Secondary | ICD-10-CM | POA: Diagnosis not present

## 2015-10-19 DIAGNOSIS — G40309 Generalized idiopathic epilepsy and epileptic syndromes, not intractable, without status epilepticus: Secondary | ICD-10-CM | POA: Diagnosis not present

## 2015-10-19 DIAGNOSIS — Z79899 Other long term (current) drug therapy: Secondary | ICD-10-CM | POA: Diagnosis not present

## 2015-10-26 DIAGNOSIS — Z01419 Encounter for gynecological examination (general) (routine) without abnormal findings: Secondary | ICD-10-CM | POA: Diagnosis not present

## 2015-10-26 DIAGNOSIS — Z1329 Encounter for screening for other suspected endocrine disorder: Secondary | ICD-10-CM | POA: Diagnosis not present

## 2015-10-26 DIAGNOSIS — Z1321 Encounter for screening for nutritional disorder: Secondary | ICD-10-CM | POA: Diagnosis not present

## 2015-10-26 DIAGNOSIS — Z6826 Body mass index (BMI) 26.0-26.9, adult: Secondary | ICD-10-CM | POA: Diagnosis not present

## 2015-10-26 DIAGNOSIS — R87610 Atypical squamous cells of undetermined significance on cytologic smear of cervix (ASC-US): Secondary | ICD-10-CM | POA: Diagnosis not present

## 2015-10-26 DIAGNOSIS — Z1151 Encounter for screening for human papillomavirus (HPV): Secondary | ICD-10-CM | POA: Diagnosis not present

## 2015-12-21 ENCOUNTER — Ambulatory Visit (INDEPENDENT_AMBULATORY_CARE_PROVIDER_SITE_OTHER): Payer: BLUE CROSS/BLUE SHIELD | Admitting: Family Medicine

## 2015-12-21 VITALS — BP 106/64 | HR 80 | Temp 98.3°F | Resp 16 | Ht 69.0 in | Wt 175.6 lb

## 2015-12-21 DIAGNOSIS — J069 Acute upper respiratory infection, unspecified: Secondary | ICD-10-CM | POA: Diagnosis not present

## 2015-12-21 MED ORDER — HYDROCOD POLST-CPM POLST ER 10-8 MG/5ML PO SUER: 5.0000 mL | Freq: Two times a day (BID) | ORAL | 0 refills | Status: DC | PRN

## 2015-12-21 MED ORDER — BENZONATATE 100 MG PO CAPS: 100.0000 mg | ORAL_CAPSULE | Freq: Three times a day (TID) | ORAL | 0 refills | Status: DC | PRN

## 2015-12-21 MED ORDER — AMOXICILLIN-POT CLAVULANATE 875-125 MG PO TABS: 1.0000 | ORAL_TABLET | Freq: Two times a day (BID) | ORAL | 0 refills | Status: DC

## 2015-12-21 NOTE — Progress Notes (Signed)
Patient ID: Courtney HeysCaitlin Morrell, female    DOB: Jul 14, 1980, 35 y.o.   MRN: [id number]  PCP: Dara LordsFONTAINE,TIMOTHY P, MD  Chief Complaint  Patient presents with  . Cough    x 1 week, green mucus    Subjective:   HPI 35 year old female presents for evaluation of cough times 1 week.  She reports fatigue without known fever. Illness began as clear nasal drainage. She subsequently lost her voice for three days and has since regained her voice. Illness began as clear nasal drainage. Reports fatigue and cough for over 1 week to the point of causing her to vomit. Reports a mid-sternum soreness in the beginning of her illness which has since resolved. Denies wheezing or shortness of breath. Took some Delsym for cough which mildly improved symptoms.  Social History   Social History  . Marital status: Married    Spouse name: N/A  . Number of children: N/A  . Years of education: N/A   Occupational History  . Not on file.   Social History Main Topics  . Smoking status: Former Smoker    Quit date: 06/16/2010  . Smokeless tobacco: Never Used  . Alcohol use Yes     Comment: occasional  . Drug use: No  . Sexual activity: Yes    Birth control/ protection: Pill   Other Topics Concern  . Not on file   Social History Narrative  . No narrative on file   Family History  Problem Relation Age of Onset  . Heart disease Father   . Cancer Father     Prostate, thyroid  . Breast cancer Maternal Grandmother 170  . COPD Paternal Grandfather   . Asthma Brother    Review of Systems See HPI   Patient Active Problem List   Diagnosis Date Noted  . Normal labor and delivery 06/17/2014  . Postpartum care following vaginal delivery-waterbirth (5/10) 06/17/2014  . Cervical dysplasia 03/19/2012     Prior to Admission medications   Medication Sig Start Date End Date Taking? Authorizing Provider  LamoTRIgine 100 MG TB24 Take 300 mg by mouth daily.   Yes Historical Provider, MD  ibuprofen (ADVIL,MOTRIN)  600 MG tablet Take 1 tablet (600 mg total) by mouth every 6 (six) hours. Patient not taking: Reported on 12/21/2015 06/19/14   Marlinda Mikeanya Bailey, CNM  oseltamivir (TAMIFLU) 75 MG capsule Take 1 capsule (75 mg total) by mouth every 12 (twelve) hours. Patient not taking: Reported on 12/21/2015 04/26/15   Tharon AquasFrank C Patrick, PA  Prenatal Vit-Fe Fumarate-FA (PRENATAL MULTIVITAMIN) TABS tablet Take 3 tablets by mouth daily at 12 noon.    Historical Provider, MD  ranitidine (ZANTAC) 150 MG tablet Take 150 mg by mouth 2 (two) times daily.    Historical Provider, MD  UNABLE TO FIND Med Name: Canila (BCP)    Historical Provider, MD     Allergies  Allergen Reactions  . Imiquimod     aldara - burning  . Levetiracetam Other (See Comments)    Depression      Objective:  Physical Exam  Constitutional: She is oriented to person, place, and time. She appears well-developed and well-nourished.  HENT:  Head: Normocephalic and atraumatic.  Right Ear: External ear normal.  Left Ear: External ear normal.  Nose: Mucosal edema and rhinorrhea present. Right sinus exhibits no maxillary sinus tenderness and no frontal sinus tenderness. Left sinus exhibits no maxillary sinus tenderness and no frontal sinus tenderness.  Mouth/Throat: Posterior oropharyngeal edema present.  Cardiovascular: Normal rate,  regular rhythm, normal heart sounds and intact distal pulses.   Pulmonary/Chest: Effort normal and breath sounds normal.  Musculoskeletal: Normal range of motion.  Neurological: She is alert and oriented to person, place, and time.  Skin: Skin is warm and dry.  Psychiatric: She has a normal mood and affect. Her behavior is normal. Judgment and thought content normal.   Vitals:   12/21/15 0844  BP: 106/64  Pulse: 80  Resp: 16  Temp: 98.3 F (36.8 C)   Vitals:   12/21/15 0844  Weight: 175 lb 9.6 oz (79.7 kg)  Height: 5\' 9"  (1.753 m)     Assessment & Plan:  1. Acute upper respiratory infection  Plan: .  benzonatate (TESSALON) 100 MG capsule    Sig: Take 1-2 capsules (100-200 mg total) by mouth 3 (three) times daily as needed for cough.  . chlorpheniramine-HYDROcodone (TUSSIONEX PENNKINETIC ER) 10-8 MG/5ML SUER    Sig: Take 5 mLs by mouth every 12 (twelve) hours as needed for cough.  Marland Kitchen. amoxicillin-clavulanate (AUGMENTIN) 875-125 MG tablet    Sig: Take 1 tablet by mouth 2 (two) times daily.   If symptoms, do not improve within 48 hours, start Augmentin 1 tablet, twice daily for 10 days.   Godfrey PickKimberly S. Tiburcio PeaHarris, MSN, FNP-C [facility]

## 2015-12-21 NOTE — Patient Instructions (Addendum)
Take Tussionex at night time for cough.  Take benzonatate 100-200 mg up to 3 times daily for cough.  If symptoms, do not improve within 48 hours, start Augmentin 1 tablet, twice daily for 10 days.    IF you received an x-ray today, you will receive an invoice from [facility]. Please contact [facility] at [phone] with questions or concerns regarding your invoice.   IF you received labwork today, you will receive an invoice from [facility]. Please contact [facility] at [phone] with questions or concerns regarding your invoice.   Our billing staff will not be able to assist you with questions regarding bills from these companies.  You will be contacted with the lab results as soon as they are available. The fastest way to get your results is to activate your My Chart account. Instructions are located on the last page of this paperwork. If you have not heard from us regarding the results in 2 weeks, please contact this office.     Upper Respiratory Infection, Adult Most upper respiratory infections (URIs) are a viral infection of the air passages leading to the lungs. A URI affects the nose, throat, and upper air passages. The most common type of URI is nasopharyngitis and is typically referred to as "the common cold." URIs run their course and usually go away on their own. Most of the time, a URI does not require medical attention, but sometimes a bacterial infection in the upper airways can follow a viral infection. This is called a secondary infection. Sinus and middle ear infections are common types of secondary upper respiratory infections. Bacterial pneumonia can also complicate a URI. A URI can worsen asthma and chronic obstructive pulmonary disease (COPD). Sometimes, these complications can require emergency medical care and may be life threatening.  CAUSES Almost all URIs are caused by viruses. A virus is a type of germ and can spread  from one person to another.  RISKS FACTORS You may be at risk for a URI if:   You smoke.   You have chronic heart or lung disease.  You have a weakened defense (immune) system.   You are very young or very old.   You have nasal allergies or asthma.  You work in crowded or poorly ventilated areas.  You work in health care facilities or schools. SIGNS AND SYMPTOMS  Symptoms typically develop 2-3 days after you come in contact with a cold virus. Most viral URIs last 7-10 days. However, viral URIs from the influenza virus (flu virus) can last 14-18 days and are typically more severe. Symptoms may include:   Runny or stuffy (congested) nose.   Sneezing.   Cough.   Sore throat.   Headache.   Fatigue.   Fever.   Loss of appetite.   Pain in your forehead, behind your eyes, and over your cheekbones (sinus pain).  Muscle aches.  DIAGNOSIS  Your health care provider may diagnose a URI by:  Physical exam.  Tests to check that your symptoms are not due to another condition such as:  Strep throat.  Sinusitis.  Pneumonia.  Asthma. TREATMENT  A URI goes away on its own with time. It cannot be cured with medicines, but medicines may be prescribed or recommended to relieve symptoms. Medicines may help:  Reduce your fever.  Reduce your cough.  Relieve nasal congestion. HOME CARE INSTRUCTIONS   Take medicines only as directed by your health care provider.   Gargle warm saltwater or take cough  drops to comfort your throat as directed by your health care provider.  Use a warm mist humidifier or inhale steam from a shower to increase air moisture. This may make it easier to breathe.  Drink enough fluid to keep your urine clear or pale yellow.   Eat soups and other clear broths and maintain good nutrition.   Rest as needed.   Return to work when your temperature has returned to normal or as your health care provider advises. You may need to stay home  longer to avoid infecting others. You can also use a face mask and careful hand washing to prevent spread of the virus.  Increase the usage of your inhaler if you have asthma.   Do not use any tobacco products, including cigarettes, chewing tobacco, or electronic cigarettes. If you need help quitting, ask your health care provider. PREVENTION  The best way to protect yourself from getting a cold is to practice good hygiene.   Avoid oral or hand contact with people with cold symptoms.   Wash your hands often if contact occurs.  There is no clear evidence that vitamin C, vitamin E, echinacea, or exercise reduces the chance of developing a cold. However, it is always recommended to get plenty of rest, exercise, and practice good nutrition.  SEEK MEDICAL CARE IF:   You are getting worse rather than better.   Your symptoms are not controlled by medicine.   You have chills.  You have worsening shortness of breath.  You have brown or red mucus.  You have yellow or brown nasal discharge.  You have pain in your face, especially when you bend forward.  You have a fever.  You have swollen neck glands.  You have pain while swallowing.  You have white areas in the back of your throat. SEEK IMMEDIATE MEDICAL CARE IF:   You have severe or persistent:  Headache.  Ear pain.  Sinus pain.  Chest pain.  You have chronic lung disease and any of the following:  Wheezing.  Prolonged cough.  Coughing up blood.  A change in your usual mucus.  You have a stiff neck.  You have changes in your:  Vision.  Hearing.  Thinking.  Mood. MAKE SURE YOU:   Understand these instructions.  Will watch your condition.  Will get help right away if you are not doing well or get worse.   This information is not intended to replace advice given to you by your health care provider. Make sure you discuss any questions you have with your health care provider.   Document Released:  07/20/2000 Document Revised: 06/10/2014 Document Reviewed: 05/01/2013 Elsevier Interactive Patient Education [employer]2016 Elsevier Inc.

## 2016-06-30 DIAGNOSIS — F331 Major depressive disorder, recurrent, moderate: Secondary | ICD-10-CM | POA: Diagnosis not present

## 2016-07-12 DIAGNOSIS — F331 Major depressive disorder, recurrent, moderate: Secondary | ICD-10-CM | POA: Diagnosis not present

## 2016-07-18 DIAGNOSIS — Z87891 Personal history of nicotine dependence: Secondary | ICD-10-CM | POA: Diagnosis not present

## 2016-07-18 DIAGNOSIS — Z79899 Other long term (current) drug therapy: Secondary | ICD-10-CM | POA: Diagnosis not present

## 2016-07-18 DIAGNOSIS — G40109 Localization-related (focal) (partial) symptomatic epilepsy and epileptic syndromes with simple partial seizures, not intractable, without status epilepticus: Secondary | ICD-10-CM | POA: Diagnosis not present

## 2016-07-26 DIAGNOSIS — F331 Major depressive disorder, recurrent, moderate: Secondary | ICD-10-CM | POA: Diagnosis not present

## 2016-09-02 ENCOUNTER — Encounter: Payer: Self-pay | Admitting: Family Medicine

## 2016-09-02 ENCOUNTER — Ambulatory Visit (INDEPENDENT_AMBULATORY_CARE_PROVIDER_SITE_OTHER): Payer: BLUE CROSS/BLUE SHIELD | Admitting: Family Medicine

## 2016-09-02 VITALS — BP 121/80 | HR 77 | Temp 98.3°F | Resp 18 | Ht 68.7 in | Wt 178.8 lb

## 2016-09-02 DIAGNOSIS — Z3041 Encounter for surveillance of contraceptive pills: Secondary | ICD-10-CM

## 2016-09-02 DIAGNOSIS — N926 Irregular menstruation, unspecified: Secondary | ICD-10-CM | POA: Diagnosis not present

## 2016-09-02 DIAGNOSIS — R195 Other fecal abnormalities: Secondary | ICD-10-CM

## 2016-09-02 DIAGNOSIS — F53 Postpartum depression: Secondary | ICD-10-CM

## 2016-09-02 DIAGNOSIS — O99345 Other mental disorders complicating the puerperium: Secondary | ICD-10-CM

## 2016-09-02 DIAGNOSIS — R1013 Epigastric pain: Secondary | ICD-10-CM

## 2016-09-02 NOTE — Progress Notes (Signed)
Chief Complaint  Patient presents with  . Abdominal Pain    stomach issues   . Diarrhea    HPI   Epigastric Pain and loose stools She reports that she has been having epigastric pain She reports that she has loose stools with urgency to defecate is only in the mornings She has 1 BM daily with some nausea She feels bloated and reports that she just feels like she is not digesting her food well.  She reports that she has mucus in her stool She denies belching and flatuence She reports that she does not get cramping with bowel movements  She reports that her breakfast is typically a kind bar that is made large of nuts  She usually has a salad, and sandwich She typically cooks a protein and starch and vegetables  She does not eat a lot of fast foods She drinks alcohol 5 days a week and about 4-5 ounces of white wine a day She reports that she had a beer instead and felt like she had diarrhea twice.   She denies fevers or chills  Wt Readings from Last 3 Encounters:  09/02/16 178 lb 12.8 oz (81.1 kg)  12/21/15 175 lb 9.6 oz (79.7 kg)  06/17/14 200 lb (90.7 kg)   She does not exercise.  She feels  Like she has some weight gain  Patient's last menstrual period was 09/02/2016. She has irregular menses She reports that her periods are about every 90 days  She takes a month of ocps at a time  Seizure disorder Her last seizure was in January of 2010   Past Medical History:  Diagnosis Date  . Anxiety   . ASCUS favor benign 04/2013   Ascus Pap smear negative high-risk HPV, negative ECC recommend followup Pap/HPV one year  . CIN III with severe dysplasia   . Epilepsy (HCC)    last seizure 02/2008  . LGSIL (low grade squamous intraepithelial dysplasia) 10/2012   Cervical biopsy and ECC  . Seizures (HCC)   . Vaginal Pap smear, abnormal     Current Outpatient Prescriptions  Medication Sig Dispense Refill  . ibuprofen (ADVIL,MOTRIN) 600 MG tablet Take 1 tablet (600 mg total)  by mouth every 6 (six) hours. 30 tablet 0  . APRI 0.15-30 MG-MCG tablet Take 1 tablet by mouth daily.  7  . Prenatal Vit-Fe Fumarate-FA (PRENATAL MULTIVITAMIN) TABS tablet Take 3 tablets by mouth daily at 12 noon.    . ranitidine (ZANTAC) 150 MG tablet Take 150 mg by mouth 2 (two) times daily.    Marland Kitchen. UNABLE TO FIND Med Name: Canila (BCP)     No current facility-administered medications for this visit.     Allergies:  Allergies  Allergen Reactions  . Imiquimod     aldara - burning  . Levetiracetam Other (See Comments)    Depression    Past Surgical History:  Procedure Laterality Date  . CERVICAL CONE BIOPSY  2006, 2008   X 2    Social History   Social History  . Marital status: Married    Spouse name: N/A  . Number of children: N/A  . Years of education: N/A   Social History Main Topics  . Smoking status: Former Smoker    Quit date: 06/16/2010  . Smokeless tobacco: Never Used  . Alcohol use Yes     Comment: occasional  . Drug use: No  . Sexual activity: Yes    Birth control/ protection: Pill   Other Topics Concern  .  None   Social History Narrative  . None    ROS See hpi  Objective: Vitals:   09/02/16 1605  BP: 121/80  Pulse: 77  Resp: 18  Temp: 98.3 F (36.8 C)  TempSrc: Oral  SpO2: 98%  Weight: 178 lb 12.8 oz (81.1 kg)  Height: 5' 8.7" (1.745 m)  Body mass index is 26.63 kg/m.   Physical Exam  Constitutional: She is oriented to person, place, and time. She appears well-developed and well-nourished.  HENT:  Head: Normocephalic and atraumatic.  Eyes: Conjunctivae and EOM are normal.  Neck: Normal range of motion. No thyromegaly present.  Cardiovascular: Normal rate, regular rhythm and normal heart sounds.   Pulmonary/Chest: Effort normal and breath sounds normal. No respiratory distress. She has no wheezes.  Abdominal: Soft. Bowel sounds are normal. She exhibits no distension. There is no tenderness. There is no guarding.  Musculoskeletal:  Normal range of motion. She exhibits no edema.  Neurological: She is alert and oriented to person, place, and time. She displays normal reflexes. No cranial nerve deficit.  Skin: Skin is warm. No erythema.  Psychiatric: She has a normal mood and affect. Her behavior is normal. Judgment and thought content normal.     Assessment and Plan Shirley Stephenson was seen today for abdominal pain and diarrhea.  Diagnoses and all orders for this visit:  Loose stools- will check for underlying causes -     Lipid panel -     Hemoglobin A1c -     Comprehensive metabolic panel -     TSH -     Celiac Panel -     Lipase  Abdominal pain, epigastric- advised to try gasX -     Comprehensive metabolic panel -     TSH -     Celiac Panel  Irregular menses Encounter for surveillance of contraceptive pills -  Likely due to ovarian function suppression on OCPs -  Will also check tsh and other labs  Postpartum depression Currently in CBT for postpartum depression  On lamictal for seizure disorder   2.5 weeks to discuss a plan for moving forward    Shirley Stephenson

## 2016-09-02 NOTE — Patient Instructions (Addendum)
   IF you received an x-ray today, you will receive an invoice from [facility]. Please contact [facility] at [phone] with questions or concerns regarding your invoice.   IF you received labwork today, you will receive an invoice from [facility]. Please contact [facility] at [phone] with questions or concerns regarding your invoice.   Our billing staff will not be able to assist you with questions regarding bills from these companies.  You will be contacted with the lab results as soon as they are available. The fastest way to get your results is to activate your My Chart account. Instructions are located on the last page of this paperwork. If you have not heard from us regarding the results in 2 weeks, please contact this office.    Thyroid-Stimulating Hormone Test Why am I having this test? A thyroid-stimulating hormone (TSH) test is a blood test that is done to measure the level of TSH, also known as thyrotropin, in your blood. TSH is produced by the pituitary gland. The pituitary gland is a small organ located just below the brain, behind your eyes and nasal passages. It is part of a system that monitors and maintains thyroid hormone levels and thyroid gland function. Thyroid hormones affect many body parts and systems, including the system that affects how quickly your body burns fuel for energy. Your health care provider may recommend testing your TSH level if you have signs and symptoms of abnormal thyroid hormone levels. Knowing the level of TSH in your blood can help your health care provider:  Diagnose a thyroid gland or pituitary gland disorder.  Manage your condition and treatment if you have hypothyroidism or hyperthyroidism.  What kind of sample is taken? A blood sample is required for this test. It is usually collected by inserting a needle into a vein. How do I prepare for this test? There is no preparation required for this test. What are the  reference ranges? Reference rangesare considered healthy rangesestablished after testing a large group of healthy people. Reference rangesmay vary among different people, labs, and hospitals. It is your responsibility to obtain your test results. Ask the lab or department performing the test when and how you will get your results. Range of Normal Values:  Adult: 0.3-5 microunits/mL or 0.3-5 milliunits/L (SI units).  Newborn: 3-18 microunits/mL or 3-18 milliunits/L.  Cord: 3-12 microunits/mL or 3-12 milliunits/L.  What do the results mean? A high level of TSH may mean:  Your thyroid gland is not making enough thyroid hormones. When the thyroid gland does not make enough thyroid hormones, the pituitary gland releases TSH into the bloodstream. The higher-than-normal levels of TSH prompt the thyroid gland to release more thyroid hormones.  You are getting an insufficient level of thyroid hormone medicine, if you are receiving this type of treatment.  There is a problem with the pituitary gland (rare).  A low level of TSH can indicate a problem with the pituitary gland. Talk with your health care provider to discuss your results, treatment options, and if necessary, the need for more tests. Talk with your health care provider if you have any questions about your results. Talk with your health care provider to discuss your results, treatment options, and if necessary, the need for more tests. Talk with your health care provider if you have any questions about your results. This information is not intended to replace advice given to you by your health care provider. Make sure you discuss any questions you have with your   health care provider. Document Released: 02/19/2004 Document Revised: 09/27/2015 Document Reviewed: 06/19/2013 Elsevier Interactive Patient Education  2018 Elsevier Inc.  

## 2016-09-05 LAB — COMPREHENSIVE METABOLIC PANEL
ALT: 8 IU/L (ref 0–32)
AST: 13 IU/L (ref 0–40)
Albumin/Globulin Ratio: 1.8 (ref 1.2–2.2)
Albumin: 4.3 g/dL (ref 3.5–5.5)
Alkaline Phosphatase: 37 IU/L — ABNORMAL LOW (ref 39–117)
BUN/Creatinine Ratio: 10 (ref 9–23)
BUN: 8 mg/dL (ref 6–20)
Bilirubin Total: <0.2 mg/dL (ref 0.0–1.2)
CO2: 22 mmol/L (ref 20–29)
CREATININE: 0.78 mg/dL (ref 0.57–1.00)
Calcium: 8.7 mg/dL (ref 8.7–10.2)
Chloride: 103 mmol/L (ref 96–106)
GFR calc non Af Amer: 97 mL/min/{1.73_m2} (ref >59)
GFR, EST AFRICAN AMERICAN: 112 mL/min/{1.73_m2} (ref >59)
GLOBULIN, TOTAL: 2.4 g/dL (ref 1.5–4.5)
Glucose: 102 mg/dL — ABNORMAL HIGH (ref 65–99)
Potassium: 4.5 mmol/L (ref 3.5–5.2)
SODIUM: 139 mmol/L (ref 134–144)
TOTAL PROTEIN: 6.7 g/dL (ref 6.0–8.5)

## 2016-09-05 LAB — HEMOGLOBIN A1C
Est. average glucose Bld gHb Est-mCnc: 91 mg/dL
Hgb A1c MFr Bld: 4.8 % (ref 4.8–5.6)

## 2016-09-05 LAB — GLIA (IGA/G) + TTG IGA
Antigliadin Abs, IgA: 3 units (ref 0–19)
Gliadin IgG: 1 units (ref 0–19)

## 2016-09-05 LAB — LIPID PANEL
CHOL/HDL RATIO: 2.8 ratio (ref 0.0–4.4)
CHOLESTEROL TOTAL: 167 mg/dL (ref 100–199)
HDL: 60 mg/dL (ref >39)
LDL CALC: 80 mg/dL (ref 0–99)
TRIGLYCERIDES: 133 mg/dL (ref 0–149)
VLDL Cholesterol Cal: 27 mg/dL (ref 5–40)

## 2016-09-05 LAB — TSH: TSH: 1.6 u[IU]/mL (ref 0.450–4.500)

## 2016-09-05 LAB — LIPASE: Lipase: 47 U/L (ref 14–72)

## 2016-09-13 ENCOUNTER — Ambulatory Visit (INDEPENDENT_AMBULATORY_CARE_PROVIDER_SITE_OTHER): Payer: BLUE CROSS/BLUE SHIELD | Admitting: Family Medicine

## 2016-09-13 VITALS — BP 110/75 | HR 79 | Temp 98.9°F | Resp 18 | Ht 68.75 in | Wt 179.6 lb

## 2016-09-13 DIAGNOSIS — R195 Other fecal abnormalities: Secondary | ICD-10-CM

## 2016-09-13 MED ORDER — DICYCLOMINE HCL 10 MG PO CAPS: 10.0000 mg | ORAL_CAPSULE | Freq: Three times a day (TID) | ORAL | 0 refills | Status: AC

## 2016-09-13 NOTE — Patient Instructions (Addendum)
   IF you received an x-ray today, you will receive an invoice from [facility]. Please contact [facility] at [phone] with questions or concerns regarding your invoice.   IF you received labwork today, you will receive an invoice from [facility]. Please contact [facility] at [phone] with questions or concerns regarding your invoice.   Our billing staff will not be able to assist you with questions regarding bills from these companies.  You will be contacted with the lab results as soon as they are available. The fastest way to get your results is to activate your My Chart account. Instructions are located on the last page of this paperwork. If you have not heard from us regarding the results in 2 weeks, please contact this office.    Diet for Irritable Bowel Syndrome When you have irritable bowel syndrome (IBS), the foods you eat and your eating habits are very important. IBS may cause various symptoms, such as abdominal pain, constipation, or diarrhea. Choosing the right foods can help ease discomfort caused by these symptoms. Work with your health care provider and dietitian to find the best eating plan to help control your symptoms. What general guidelines do I need to follow?  Keep a food diary. This will help you identify foods that cause symptoms. Write down: ? What you eat and when. ? What symptoms you have. ? When symptoms occur in relation to your meals.  Avoid foods that cause symptoms. Talk with your dietitian about other ways to get the same nutrients that are in these foods.  Eat more foods that contain fiber. Take a fiber supplement if directed by your dietitian.  Eat your meals slowly, in a relaxed setting.  Aim to eat 5-6 small meals per day. Do not skip meals.  Drink enough fluids to keep your urine clear or pale yellow.  Ask your health care provider if you should take an over-the-counter probiotic during flare-ups to help restore healthy  gut bacteria.  If you have cramping or diarrhea, try making your meals low in fat and high in carbohydrates. Examples of carbohydrates are pasta, rice, whole grain breads and cereals, fruits, and vegetables.  If dairy products cause your symptoms to flare up, try eating less of them. You might be able to handle yogurt better than other dairy products because it contains bacteria that help with digestion. What foods are not recommended? The following are some foods and drinks that may worsen your symptoms:  Fatty foods, such as French fries.  Milk products, such as cheese or ice cream.  Chocolate.  Alcohol.  Products with caffeine, such as coffee.  Carbonated drinks, such as soda.  The items listed above may not be a complete list of foods and beverages to avoid. Contact your dietitian for more information. What foods are good sources of fiber? Your health care provider or dietitian may recommend that you eat more foods that contain fiber. Fiber can help reduce constipation and other IBS symptoms. Add foods with fiber to your diet a little at a time so that your body can get used to them. Too much fiber at once might cause gas and swelling of your abdomen. The following are some foods that are good sources of fiber:  Apples.  Peaches.  Pears.  Berries.  Figs.  Broccoli (raw).  Cabbage.  Carrots.  Raw peas.  Kidney beans.  Lima beans.  Whole grain bread.  Whole grain cereal.  Where to find more information: International Foundation for Functional   Gastrointestinal Disorders: www.iffgd.org National Institute of Diabetes and Digestive and Kidney Diseases: www.niddk.nih.gov/health-information/health-topics/digestive-diseases/ibs/Pages/facts.aspx This information is not intended to replace advice given to you by your health care provider. Make sure you discuss any questions you have with your health care provider. Document Released: 04/16/2003 Document Revised:  07/02/2015 Document Reviewed: 04/26/2013 Elsevier Interactive Patient Education  2018 Elsevier Inc.  

## 2016-09-13 NOTE — Progress Notes (Signed)
Chief Complaint  Patient presents with  . Follow-up    abdominal pain/loose stools. Per pt symptoms haven't eased up    HPI  Pt reports that she continues to have  Pt denies abdominal cramping She is here to discuss labs She reports that she has noticed some relationship with lactose especially with ice cream She states that she has loose stools every morning She cut out yogurt without improvement She reports that she hold her stools and reports that she only recently started using her work bathroom She does not have urgency with stools   Past Medical History:  Diagnosis Date  . Anxiety   . ASCUS favor benign 04/2013   Ascus Pap smear negative high-risk HPV, negative ECC recommend followup Pap/HPV one year  . CIN III with severe dysplasia   . Epilepsy (HCC)    last seizure 02/2008  . LGSIL (low grade squamous intraepithelial dysplasia) 10/2012   Cervical biopsy and ECC  . Seizures (HCC)   . Vaginal Pap smear, abnormal     Current Outpatient Prescriptions  Medication Sig Dispense Refill  . APRI 0.15-30 MG-MCG tablet Take 1 tablet by mouth daily.  7  . lamoTRIgine (LAMICTAL) 150 MG tablet Take 150 mg by mouth 2 (two) times daily.    Marland Kitchen dicyclomine (BENTYL) 10 MG capsule Take 1 capsule (10 mg total) by mouth 4 (four) times daily -  before meals and at bedtime. 90 capsule 0  . ibuprofen (ADVIL,MOTRIN) 600 MG tablet Take 1 tablet (600 mg total) by mouth every 6 (six) hours. (Patient not taking: Reported on 09/13/2016) 30 tablet 0  . Prenatal Vit-Fe Fumarate-FA (PRENATAL MULTIVITAMIN) TABS tablet Take 3 tablets by mouth daily at 12 noon.    . ranitidine (ZANTAC) 150 MG tablet Take 150 mg by mouth 2 (two) times daily.    Marland Kitchen UNABLE TO FIND Med Name: Canila (BCP)     No current facility-administered medications for this visit.     Allergies:  Allergies  Allergen Reactions  . Imiquimod     aldara - burning  . Levetiracetam Other (See Comments)    Depression    Past Surgical  History:  Procedure Laterality Date  . CERVICAL CONE BIOPSY  2006, 2008   X 2    Social History   Social History  . Marital status: Married    Spouse name: N/A  . Number of children: N/A  . Years of education: N/A   Social History Main Topics  . Smoking status: Former Smoker    Quit date: 06/16/2010  . Smokeless tobacco: Never Used  . Alcohol use Yes     Comment: occasional  . Drug use: No  . Sexual activity: Yes    Birth control/ protection: Pill   Other Topics Concern  . Not on file   Social History Narrative  . No narrative on file    ROS See hpi  Objective: Vitals:   09/13/16 1616  BP: 110/75  Pulse: 79  Resp: 18  Temp: 98.9 F (37.2 C)  TempSrc: Oral  SpO2: 99%  Weight: 179 lb 9.6 oz (81.5 kg)  Height: 5' 8.75" (1.746 m)    Physical Exam  Physical Exam  Constitutional: She is oriented to person, place, and time. She appears well-developed and well-nourished.  HENT:  Head: Normocephalic and atraumatic.  Eyes: Conjunctivae and EOM are normal.  Cardiovascular: Normal rate, regular rhythm and normal heart sounds.   Pulmonary/Chest: Effort normal and breath sounds normal. No respiratory distress. She  has no wheezes.  Abdominal: Normal appearance and bowel sounds are normal. There is no tenderness. There is no CVA tenderness.  Neurological: She is alert and oriented to person, place, and time.    Assessment and Plan [name] was seen today for follow-up.  Diagnoses and all orders for this visit:  Loose stools -     Discussed lab results -     Recommended Align -     Discussed that she should try an elimination diet -     She should also take bentyl prn  -     Cancel: Tdap vaccine greater than or equal to 7yo IM -     dicyclomine (BENTYL) 10 MG capsule; Take 1 capsule (10 mg total) by mouth 4 (four) times daily -  before meals and at bedtime.     Zoe A Stallings

## 2016-09-14 ENCOUNTER — Encounter: Payer: Self-pay | Admitting: Family Medicine

## 2016-09-16 ENCOUNTER — Ambulatory Visit: Payer: BLUE CROSS/BLUE SHIELD | Admitting: Family Medicine

## 2016-11-07 DIAGNOSIS — Z01419 Encounter for gynecological examination (general) (routine) without abnormal findings: Secondary | ICD-10-CM | POA: Diagnosis not present

## 2016-11-07 DIAGNOSIS — Z309 Encounter for contraceptive management, unspecified: Secondary | ICD-10-CM | POA: Diagnosis not present

## 2016-11-07 DIAGNOSIS — Z6825 Body mass index (BMI) 25.0-25.9, adult: Secondary | ICD-10-CM | POA: Diagnosis not present

## 2017-02-06 DIAGNOSIS — F4323 Adjustment disorder with mixed anxiety and depressed mood: Secondary | ICD-10-CM | POA: Diagnosis not present

## 2017-02-27 DIAGNOSIS — F4323 Adjustment disorder with mixed anxiety and depressed mood: Secondary | ICD-10-CM | POA: Diagnosis not present

## 2017-03-13 DIAGNOSIS — F4323 Adjustment disorder with mixed anxiety and depressed mood: Secondary | ICD-10-CM | POA: Diagnosis not present

## 2017-03-27 DIAGNOSIS — F4323 Adjustment disorder with mixed anxiety and depressed mood: Secondary | ICD-10-CM | POA: Diagnosis not present

## 2017-04-10 DIAGNOSIS — F4323 Adjustment disorder with mixed anxiety and depressed mood: Secondary | ICD-10-CM | POA: Diagnosis not present

## 2017-04-13 DIAGNOSIS — F3289 Other specified depressive episodes: Secondary | ICD-10-CM | POA: Diagnosis not present

## 2017-04-13 DIAGNOSIS — G40009 Localization-related (focal) (partial) idiopathic epilepsy and epileptic syndromes with seizures of localized onset, not intractable, without status epilepticus: Secondary | ICD-10-CM | POA: Diagnosis not present

## 2017-04-13 DIAGNOSIS — F53 Postpartum depression: Secondary | ICD-10-CM | POA: Diagnosis not present

## 2017-04-13 DIAGNOSIS — Z79899 Other long term (current) drug therapy: Secondary | ICD-10-CM | POA: Diagnosis not present

## 2017-04-24 DIAGNOSIS — F4323 Adjustment disorder with mixed anxiety and depressed mood: Secondary | ICD-10-CM | POA: Diagnosis not present

## 2017-05-08 DIAGNOSIS — F4323 Adjustment disorder with mixed anxiety and depressed mood: Secondary | ICD-10-CM | POA: Diagnosis not present

## 2017-05-22 DIAGNOSIS — A630 Anogenital (venereal) warts: Secondary | ICD-10-CM | POA: Diagnosis not present

## 2017-06-26 DIAGNOSIS — F4323 Adjustment disorder with mixed anxiety and depressed mood: Secondary | ICD-10-CM | POA: Diagnosis not present

## 2017-07-17 DIAGNOSIS — F4323 Adjustment disorder with mixed anxiety and depressed mood: Secondary | ICD-10-CM | POA: Diagnosis not present

## 2017-07-21 DIAGNOSIS — A630 Anogenital (venereal) warts: Secondary | ICD-10-CM | POA: Diagnosis not present

## 2017-08-07 DIAGNOSIS — F4323 Adjustment disorder with mixed anxiety and depressed mood: Secondary | ICD-10-CM | POA: Diagnosis not present

## 2017-09-04 DIAGNOSIS — F4323 Adjustment disorder with mixed anxiety and depressed mood: Secondary | ICD-10-CM | POA: Diagnosis not present

## 2017-09-18 DIAGNOSIS — F4323 Adjustment disorder with mixed anxiety and depressed mood: Secondary | ICD-10-CM | POA: Diagnosis not present

## 2017-10-02 DIAGNOSIS — F4323 Adjustment disorder with mixed anxiety and depressed mood: Secondary | ICD-10-CM | POA: Diagnosis not present

## 2017-11-27 DIAGNOSIS — Z1159 Encounter for screening for other viral diseases: Secondary | ICD-10-CM | POA: Diagnosis not present

## 2017-11-27 DIAGNOSIS — Z6822 Body mass index (BMI) 22.0-22.9, adult: Secondary | ICD-10-CM | POA: Diagnosis not present

## 2017-11-27 DIAGNOSIS — Z118 Encounter for screening for other infectious and parasitic diseases: Secondary | ICD-10-CM | POA: Diagnosis not present

## 2017-11-27 DIAGNOSIS — Z114 Encounter for screening for human immunodeficiency virus [HIV]: Secondary | ICD-10-CM | POA: Diagnosis not present

## 2017-11-27 DIAGNOSIS — Z01419 Encounter for gynecological examination (general) (routine) without abnormal findings: Secondary | ICD-10-CM | POA: Diagnosis not present

## 2017-11-27 DIAGNOSIS — Z113 Encounter for screening for infections with a predominantly sexual mode of transmission: Secondary | ICD-10-CM | POA: Diagnosis not present

## 2017-11-27 DIAGNOSIS — Z1151 Encounter for screening for human papillomavirus (HPV): Secondary | ICD-10-CM | POA: Diagnosis not present

## 2018-01-22 DIAGNOSIS — R413 Other amnesia: Secondary | ICD-10-CM | POA: Diagnosis not present

## 2018-01-22 DIAGNOSIS — R448 Other symptoms and signs involving general sensations and perceptions: Secondary | ICD-10-CM | POA: Diagnosis not present

## 2018-01-22 DIAGNOSIS — F418 Other specified anxiety disorders: Secondary | ICD-10-CM | POA: Diagnosis not present

## 2018-01-22 DIAGNOSIS — G40009 Localization-related (focal) (partial) idiopathic epilepsy and epileptic syndromes with seizures of localized onset, not intractable, without status epilepticus: Secondary | ICD-10-CM | POA: Diagnosis not present

## 2018-06-14 DIAGNOSIS — F4323 Adjustment disorder with mixed anxiety and depressed mood: Secondary | ICD-10-CM | POA: Diagnosis not present

## 2018-06-19 DIAGNOSIS — F4323 Adjustment disorder with mixed anxiety and depressed mood: Secondary | ICD-10-CM | POA: Diagnosis not present

## 2018-06-29 DIAGNOSIS — F4323 Adjustment disorder with mixed anxiety and depressed mood: Secondary | ICD-10-CM | POA: Diagnosis not present

## 2018-07-09 DIAGNOSIS — F4323 Adjustment disorder with mixed anxiety and depressed mood: Secondary | ICD-10-CM | POA: Diagnosis not present

## 2018-07-23 DIAGNOSIS — F4323 Adjustment disorder with mixed anxiety and depressed mood: Secondary | ICD-10-CM | POA: Diagnosis not present

## 2018-07-31 DIAGNOSIS — F4323 Adjustment disorder with mixed anxiety and depressed mood: Secondary | ICD-10-CM | POA: Diagnosis not present

## 2018-08-20 DIAGNOSIS — F4323 Adjustment disorder with mixed anxiety and depressed mood: Secondary | ICD-10-CM | POA: Diagnosis not present

## 2018-08-27 ENCOUNTER — Other Ambulatory Visit: Payer: Self-pay | Admitting: *Deleted

## 2018-08-27 DIAGNOSIS — Z20822 Contact with and (suspected) exposure to covid-19: Secondary | ICD-10-CM

## 2018-08-27 NOTE — Addendum Note (Signed)
Addended by: Jonae Renshaw M on: 08/27/2018 08:15 PM   Modules accepted: Orders  

## 2018-08-29 LAB — NOVEL CORONAVIRUS, NAA: SARS-CoV-2, NAA: NOT DETECTED

## 2018-09-04 ENCOUNTER — Other Ambulatory Visit: Payer: Self-pay

## 2018-09-04 DIAGNOSIS — R6889 Other general symptoms and signs: Secondary | ICD-10-CM | POA: Diagnosis not present

## 2018-09-04 DIAGNOSIS — Z20822 Contact with and (suspected) exposure to covid-19: Secondary | ICD-10-CM

## 2018-09-06 LAB — NOVEL CORONAVIRUS, NAA: SARS-CoV-2, NAA: NOT DETECTED

## 2018-09-17 DIAGNOSIS — F4323 Adjustment disorder with mixed anxiety and depressed mood: Secondary | ICD-10-CM | POA: Diagnosis not present

## 2018-10-03 DIAGNOSIS — F4323 Adjustment disorder with mixed anxiety and depressed mood: Secondary | ICD-10-CM | POA: Diagnosis not present

## 2018-10-22 DIAGNOSIS — F4323 Adjustment disorder with mixed anxiety and depressed mood: Secondary | ICD-10-CM | POA: Diagnosis not present

## 2018-10-27 ENCOUNTER — Other Ambulatory Visit: Payer: Self-pay

## 2018-10-27 DIAGNOSIS — Z20822 Contact with and (suspected) exposure to covid-19: Secondary | ICD-10-CM

## 2018-10-28 LAB — NOVEL CORONAVIRUS, NAA: SARS-CoV-2, NAA: NOT DETECTED

## 2018-11-13 DIAGNOSIS — F4323 Adjustment disorder with mixed anxiety and depressed mood: Secondary | ICD-10-CM | POA: Diagnosis not present

## 2018-11-20 ENCOUNTER — Other Ambulatory Visit: Payer: Self-pay

## 2018-11-20 ENCOUNTER — Telehealth: Payer: BLUE CROSS/BLUE SHIELD | Admitting: Physician Assistant

## 2018-11-20 DIAGNOSIS — Z20822 Contact with and (suspected) exposure to covid-19: Secondary | ICD-10-CM

## 2018-11-20 DIAGNOSIS — Z20828 Contact with and (suspected) exposure to other viral communicable diseases: Secondary | ICD-10-CM

## 2018-11-20 NOTE — Progress Notes (Signed)
E-Visit for Corona Virus Screening   Your current symptoms could be consistent with the coronavirus.  Many health care providers can now test patients at their office but not all are.  [facility] has multiple testing sites. For information on our COVID testing locations and hours go to [email or web address]  Please quarantine yourself while awaiting your test results.  We are enrolling you in our MyChart Home [facility] for COVID19 . Daily you will receive a questionnaire within the MyChart website. Our COVID 19 response team willl be monitoriing your responses daily.  I certainly think it is reasonable for you to get tested.  It is ok for you to take allergy medications (zyrtec or flonase) for the symptoms while we wait for the test.    COVID-19 is a respiratory illness with symptoms that are similar to the flu. Symptoms are typically mild to moderate, but there have been cases of severe illness and death due to the virus. The following symptoms may appear 2-14 days after exposure: . Fever . Cough . Shortness of breath or difficulty breathing . Chills . Repeated shaking with chills . Muscle pain . Headache . Sore throat . New loss of taste or smell . Fatigue . Congestion or runny nose . Nausea or vomiting . Diarrhea  It is vitally important that if you feel that you have an infection such as this virus or any other virus that you stay home and away from places where you may spread it to others.  You should self-quarantine for 14 days if you have symptoms that could potentially be coronavirus or have been in close contact a with a person diagnosed with COVID-19 within the last 2 weeks. You should avoid contact with people age 42 and older.   You should wear a mask or cloth face covering over your nose and mouth if you must be around other people or animals, including pets (even at home). Try to stay at least 6 feet away from other people. This will protect the  people around you.  You may also take acetaminophen (Tylenol) as needed for fever.   Reduce your risk of any infection by using the same precautions used for avoiding the common cold or flu:  Marland Kitchen Wash your hands often with soap and warm water for at least 20 seconds.  If soap and water are not readily available, use an alcohol-based hand sanitizer with at least 60% alcohol.  . If coughing or sneezing, cover your mouth and nose by coughing or sneezing into the elbow areas of your shirt or coat, into a tissue or into your sleeve (not your hands). . Avoid shaking hands with others and consider head nods or verbal greetings only. . Avoid touching your eyes, nose, or mouth with unwashed hands.  . Avoid close contact with people who are sick. . Avoid places or events with large numbers of people in one location, like concerts or sporting events. . Carefully consider travel plans you have or are making. . If you are planning any travel outside or inside the [place], visit the CDC's Travelers' Health webpage for the latest health notices. . If you have some symptoms but not all symptoms, continue to monitor at home and seek medical attention if your symptoms worsen. . If you are having a medical emergency, call 911.  HOME CARE . Only take medications as instructed by your medical team. . Drink plenty of fluids and get plenty of rest. . A steam or ultrasonic humidifier can help  if you have congestion.   GET HELP RIGHT AWAY IF YOU HAVE EMERGENCY WARNING SIGNS** FOR COVID-19. If you or someone is showing any of these signs seek emergency medical care immediately. Call 911 or proceed to your closest emergency facility if: . You develop worsening high fever. . Trouble breathing . Bluish lips or face . Persistent pain or pressure in the chest . New confusion . Inability to wake or stay awake . You cough up blood. . Your symptoms become more severe  **This list is not all possible symptoms. Contact your  medical provider for any symptoms that are sever or concerning to you.   MAKE SURE YOU   Understand these instructions.  Will watch your condition.  Will get help right away if you are not doing well or get worse.  Your e-visit answers were reviewed by a board certified advanced clinical practitioner to complete your personal care plan.  Depending on the condition, your plan could have included both over the counter or prescription medications.  If there is a problem please reply once you have received a response from your provider.  Your safety is important to [place].  If you have drug allergies check your prescription carefully.    You can use MyChart to ask questions about today's visit, request a non-urgent call back, or ask for a work or school excuse for 24 hours related to this e-Visit. If it has been greater than 24 hours you will need to follow up with your provider, or enter a new e-Visit to address those concerns. You will get an e-mail in the next two days asking about your experience.  I hope that your e-visit has been valuable and will speed your recovery. Thank you for using e-visits.   Greater than 5 minutes, yet less than 10 minutes of time have been spent researching, coordinating, and implementing care for this patient today

## 2018-11-22 LAB — NOVEL CORONAVIRUS, NAA: SARS-CoV-2, NAA: NOT DETECTED

## 2018-12-03 DIAGNOSIS — F4323 Adjustment disorder with mixed anxiety and depressed mood: Secondary | ICD-10-CM | POA: Diagnosis not present

## 2018-12-08 ENCOUNTER — Other Ambulatory Visit: Payer: Self-pay

## 2018-12-08 DIAGNOSIS — Z20822 Contact with and (suspected) exposure to covid-19: Secondary | ICD-10-CM

## 2018-12-09 LAB — NOVEL CORONAVIRUS, NAA: SARS-CoV-2, NAA: NOT DETECTED

## 2018-12-10 DIAGNOSIS — Z8639 Personal history of other endocrine, nutritional and metabolic disease: Secondary | ICD-10-CM | POA: Diagnosis not present

## 2018-12-10 DIAGNOSIS — L659 Nonscarring hair loss, unspecified: Secondary | ICD-10-CM | POA: Diagnosis not present

## 2018-12-10 DIAGNOSIS — Z01419 Encounter for gynecological examination (general) (routine) without abnormal findings: Secondary | ICD-10-CM | POA: Diagnosis not present

## 2018-12-10 DIAGNOSIS — Z0000 Encounter for general adult medical examination without abnormal findings: Secondary | ICD-10-CM | POA: Diagnosis not present

## 2018-12-10 DIAGNOSIS — Z6824 Body mass index (BMI) 24.0-24.9, adult: Secondary | ICD-10-CM | POA: Diagnosis not present

## 2018-12-10 DIAGNOSIS — Z131 Encounter for screening for diabetes mellitus: Secondary | ICD-10-CM | POA: Diagnosis not present

## 2018-12-10 DIAGNOSIS — Z130 Encounter for screening for diseases of the blood and blood-forming organs and certain disorders involving the immune mechanism: Secondary | ICD-10-CM | POA: Diagnosis not present

## 2018-12-10 DIAGNOSIS — Z124 Encounter for screening for malignant neoplasm of cervix: Secondary | ICD-10-CM | POA: Diagnosis not present

## 2018-12-12 ENCOUNTER — Other Ambulatory Visit: Payer: Self-pay | Admitting: Obstetrics and Gynecology

## 2018-12-12 DIAGNOSIS — N6310 Unspecified lump in the right breast, unspecified quadrant: Secondary | ICD-10-CM

## 2018-12-17 DIAGNOSIS — F4323 Adjustment disorder with mixed anxiety and depressed mood: Secondary | ICD-10-CM | POA: Diagnosis not present

## 2018-12-21 ENCOUNTER — Ambulatory Visit
Admission: RE | Admit: 2018-12-21 | Discharge: 2018-12-21 | Disposition: A | Discharge status: Home / Self Care | Payer: BLUE CROSS/BLUE SHIELD | Source: Ambulatory Visit | Attending: Obstetrics and Gynecology | Admitting: Obstetrics and Gynecology

## 2018-12-21 ENCOUNTER — Ambulatory Visit
Admission: RE | Admit: 2018-12-21 | Discharge: 2018-12-21 | Disposition: A | Discharge status: Home / Self Care | Payer: BC Managed Care – PPO | Source: Ambulatory Visit | Attending: Obstetrics and Gynecology | Admitting: Obstetrics and Gynecology

## 2018-12-21 ENCOUNTER — Other Ambulatory Visit: Payer: Self-pay

## 2018-12-21 DIAGNOSIS — N6310 Unspecified lump in the right breast, unspecified quadrant: Secondary | ICD-10-CM

## 2018-12-21 DIAGNOSIS — N6313 Unspecified lump in the right breast, lower outer quadrant: Secondary | ICD-10-CM | POA: Diagnosis not present

## 2018-12-21 DIAGNOSIS — R922 Inconclusive mammogram: Secondary | ICD-10-CM | POA: Diagnosis not present

## 2018-12-24 ENCOUNTER — Other Ambulatory Visit: Payer: BLUE CROSS/BLUE SHIELD

## 2018-12-31 DIAGNOSIS — F4323 Adjustment disorder with mixed anxiety and depressed mood: Secondary | ICD-10-CM | POA: Diagnosis not present

## 2019-01-21 DIAGNOSIS — F4323 Adjustment disorder with mixed anxiety and depressed mood: Secondary | ICD-10-CM | POA: Diagnosis not present

## 2019-01-24 ENCOUNTER — Other Ambulatory Visit: Payer: Self-pay | Admitting: Cardiology

## 2019-01-24 DIAGNOSIS — Z20822 Contact with and (suspected) exposure to covid-19: Secondary | ICD-10-CM

## 2019-01-24 DIAGNOSIS — Z20828 Contact with and (suspected) exposure to other viral communicable diseases: Secondary | ICD-10-CM | POA: Diagnosis not present

## 2019-01-26 LAB — NOVEL CORONAVIRUS, NAA: SARS-CoV-2, NAA: NOT DETECTED

## 2019-03-11 ENCOUNTER — Ambulatory Visit: Payer: BC Managed Care – PPO | Attending: Internal Medicine

## 2019-03-11 DIAGNOSIS — Z20822 Contact with and (suspected) exposure to covid-19: Secondary | ICD-10-CM

## 2019-03-12 LAB — NOVEL CORONAVIRUS, NAA: SARS-CoV-2, NAA: NOT DETECTED

## 2019-04-22 ENCOUNTER — Other Ambulatory Visit: Payer: BC Managed Care – PPO

## 2020-08-10 ENCOUNTER — Ambulatory Visit (HOSPITAL_COMMUNITY): Payer: Self-pay

## 2020-08-10 ENCOUNTER — Ambulatory Visit (HOSPITAL_COMMUNITY): Admit: 2020-08-10 | Discharge status: Against Medical Advice | Payer: BC Managed Care – PPO

## 2020-08-13 ENCOUNTER — Telehealth: Payer: Self-pay

## 2020-08-13 NOTE — Telephone Encounter (Signed)
Left message for patient to call back to schedule with Dr. Corey for head injury. 

## 2020-09-08 IMAGING — MG DIGITAL DIAGNOSTIC BILAT W/ TOMO W/ CAD
6 of 10 series · 6 of 30 positions shown · non-contrast
Comparison: October 04, 2010

CLINICAL DATA: 38-year-old patient with palpable in the areolar
region of the right breast 8 o'clock position. She no longer
palpates a lump in the upper inner quadrant of the right breast that
was evaluated in our office in 2472 thought to be a benign
fibroadenoma.

EXAM:
DIGITAL DIAGNOSTIC BILATERAL MAMMOGRAM WITH CAD AND TOMO
ULTRASOUND RIGHT BREAST

[L MLO synth-2D]
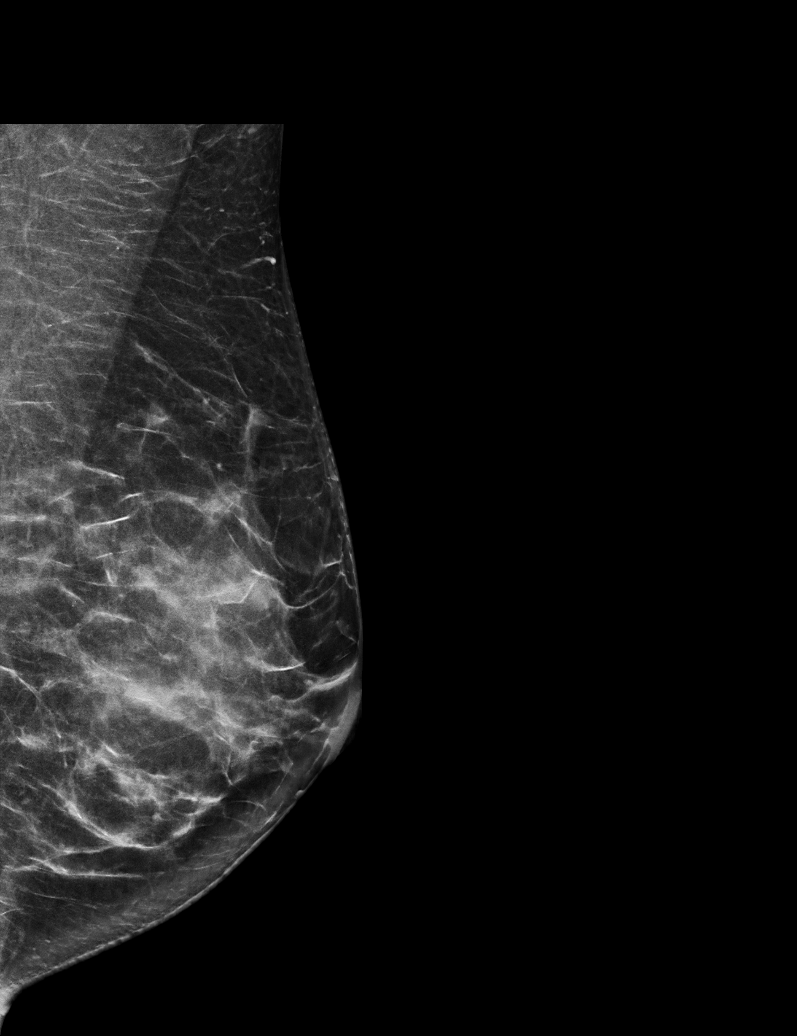

[R MLO synth-2D]
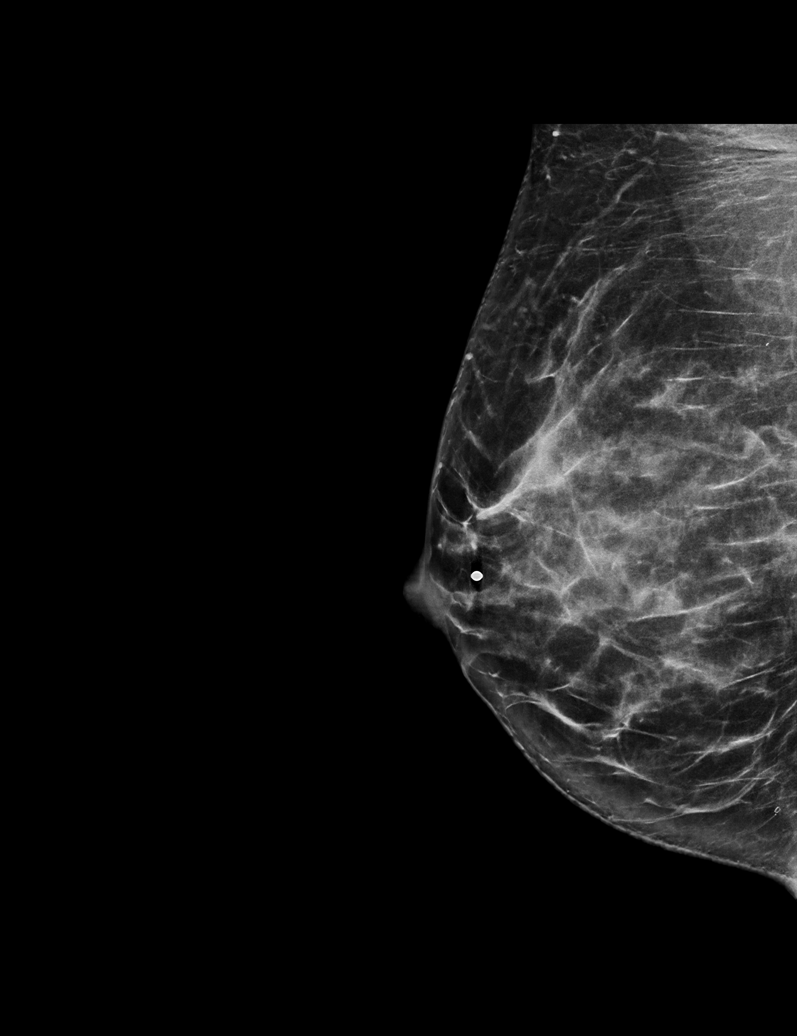

[R CC synth-2D]
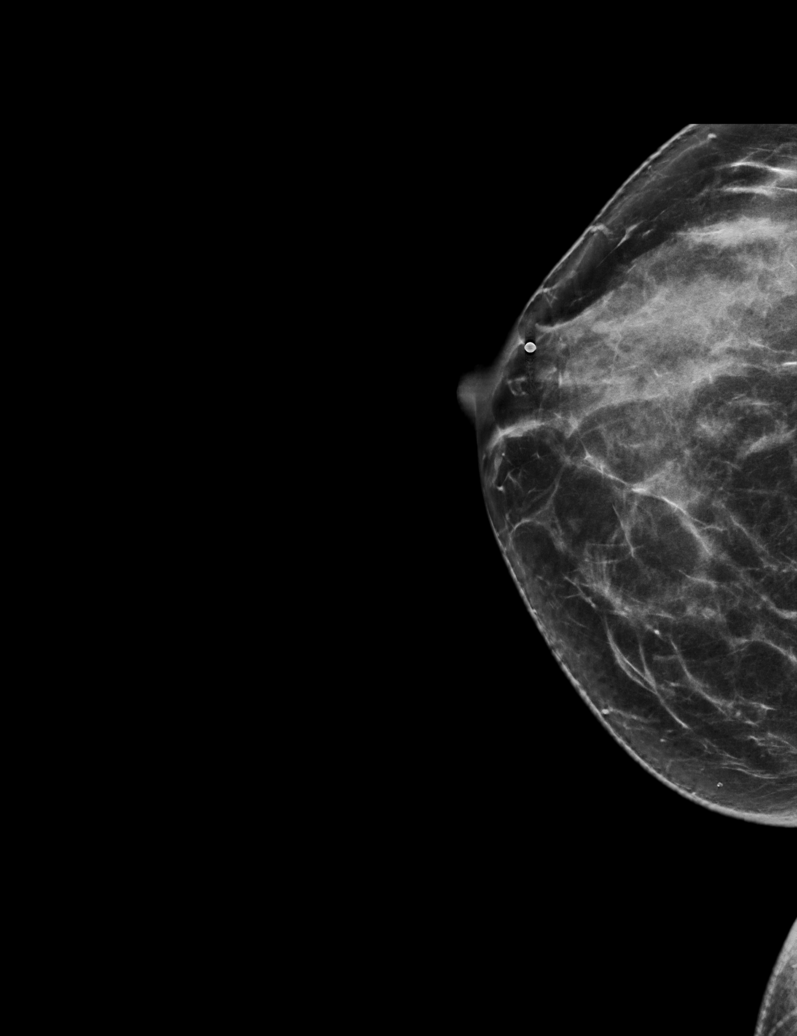

[L CC synth-2D]
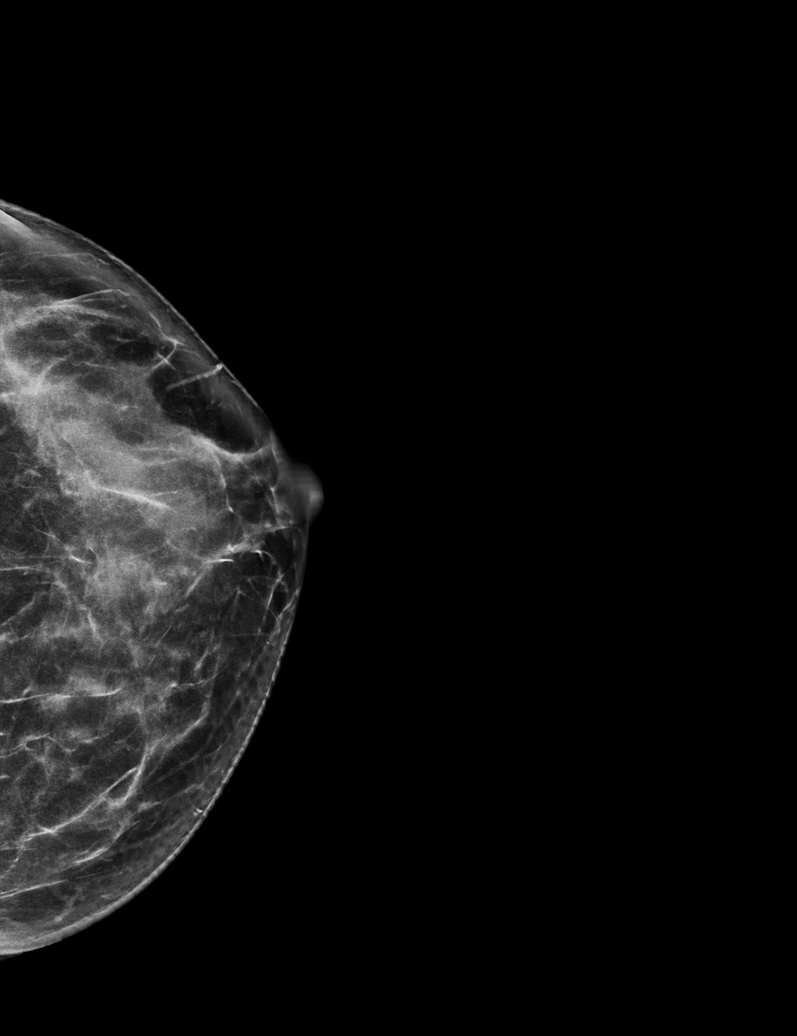

[R TAN synth-2D]
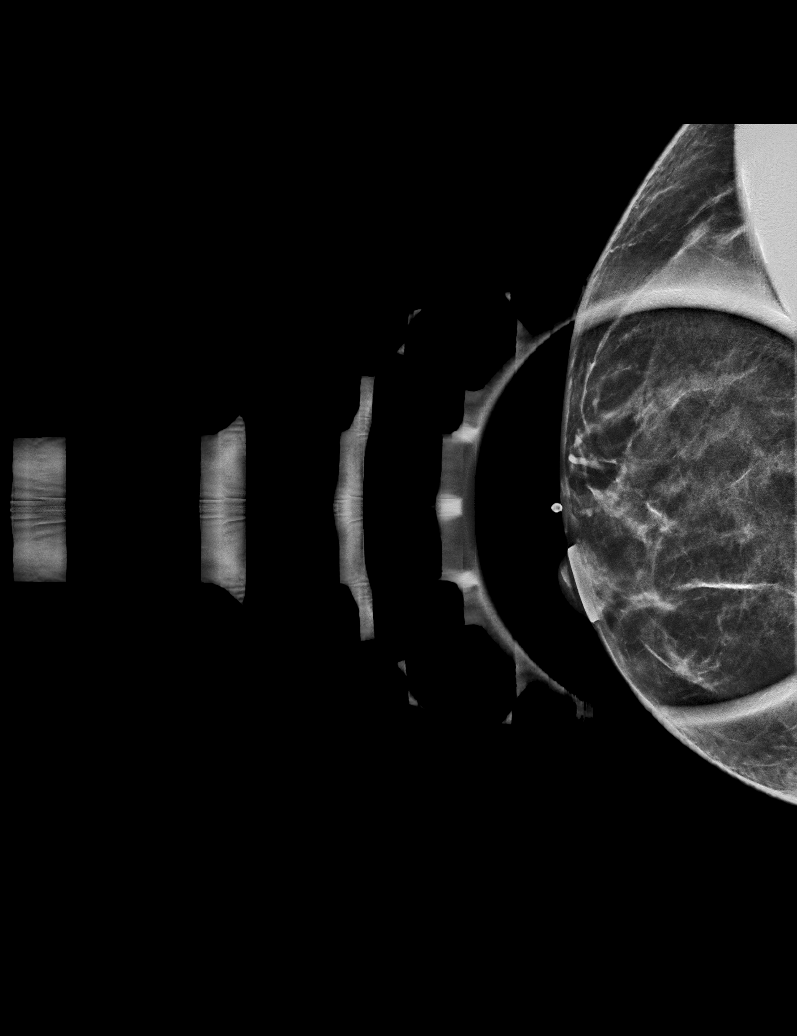

[L MLO tomo · tomo slice 35/70.0]
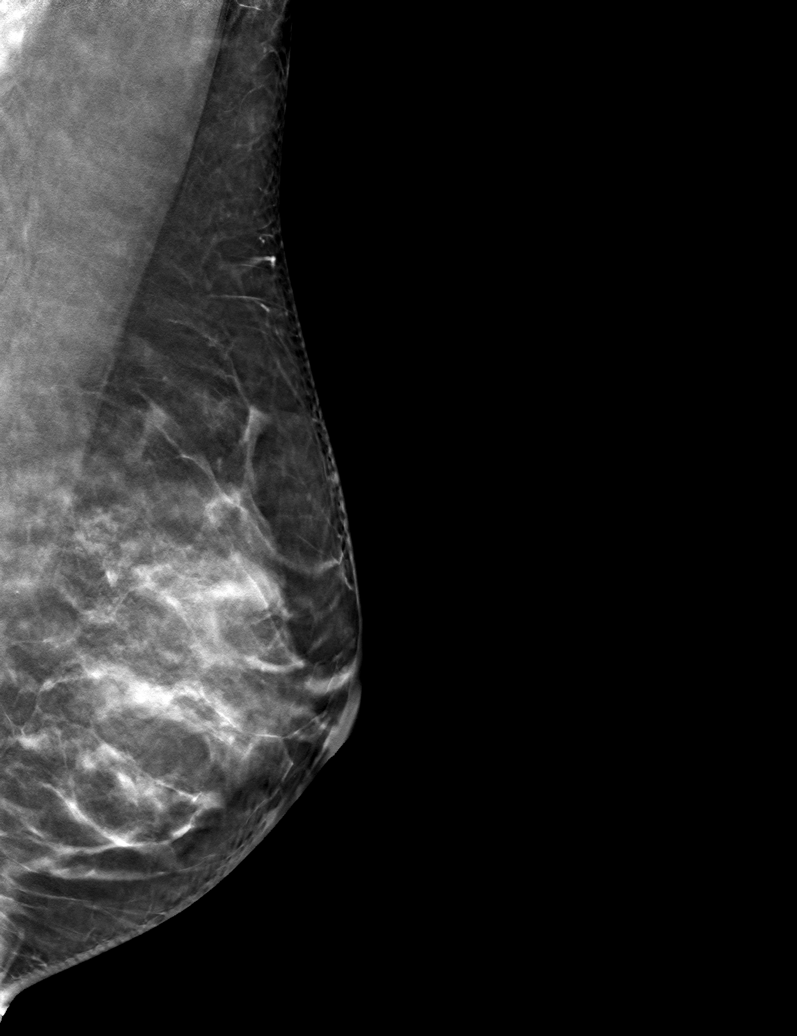

[6 of 30 positions shown; findings below may reference images not displayed]

ACR Breast Density Category c: The breast tissue is heterogeneously
dense, which may obscure small masses.
FINDINGS: Metallic skin marker was placed at the site of palpable concern the
right areola. Possible minimal skin thickening of the areola is
noted. No retroareolar mass is appreciated mammography. No mass,
distortion, or suspicious microcalcification is identified in either
the left or right breast.

Please note the previously described oval mass in the upper inner
quadrant of right breast seen on mammogram of 2472 has resolved in
the interval.

Mammographic images were processed with CAD.

On physical exam, there is a smooth superficial 5 mm lump 8 o'clock
right areola. The skin is intact and appears normal.

Targeted ultrasound is performed, showing oval hypoechoic mass with
internal slight hyperechogenicity in the dermis of the areola. There
is a thin hypoechoic tract extending to the skin surface. An
adjacent vessel is seen, but no definite internal vascularity is
appreciated.
IMPRESSION: Palpable dermal lesion associated with the right areola at 8 o'clock
position. Considerations include benign obstructed [REDACTED] gland,
benign epidermal inclusion cyst, or benign sebaceous cyst. No
suspicious findings are identified in the right breast.

No evidence of malignancy bilaterally.

RECOMMENDATION:
Patient is encouraged to return for further evaluation if the
palpable area should enlarge or change. Additionally, bilateral
screening mammogram is recommended in 1 year.

I have discussed the findings and recommendations with the patient.
If applicable, a reminder letter will be sent to the patient
regarding the next appointment.

BI-RADS CATEGORY  2: Benign.

## 2022-10-15 ENCOUNTER — Encounter (HOSPITAL_COMMUNITY): Payer: Self-pay

## 2022-10-15 ENCOUNTER — Ambulatory Visit (HOSPITAL_COMMUNITY)
Admission: RE | Admit: 2022-10-15 | Discharge: 2022-10-15 | Disposition: A | Discharge status: Home / Self Care | Payer: BC Managed Care – PPO | Source: Ambulatory Visit | Attending: Emergency Medicine | Admitting: Emergency Medicine

## 2022-10-15 ENCOUNTER — Other Ambulatory Visit: Payer: Self-pay

## 2022-10-15 VITALS — BP 124/79 | HR 91 | Temp 98.0°F | Resp 18

## 2022-10-15 DIAGNOSIS — H6122 Impacted cerumen, left ear: Secondary | ICD-10-CM | POA: Diagnosis present

## 2022-10-15 DIAGNOSIS — Z87891 Personal history of nicotine dependence: Secondary | ICD-10-CM | POA: Diagnosis not present

## 2022-10-15 DIAGNOSIS — Z113 Encounter for screening for infections with a predominantly sexual mode of transmission: Secondary | ICD-10-CM

## 2022-10-15 NOTE — ED Triage Notes (Signed)
Left ear fullness for  4 days.  Not unusual to feel fullness, usually "pops" and fullness is gone.  This time that has not happened  Has used debrox-but no wax results  Denies cough, cold or runny nose

## 2022-10-15 NOTE — Discharge Instructions (Addendum)
May use over the counter flonase as directed for symptom management.  Check my chart for results Follow up with ENT if symptoms persist(ringing in ears, ear fullness/muffled,etc) Return as needed

## 2022-10-15 NOTE — ED Provider Notes (Signed)
[facility]    CSN: [id number] Arrival date & time: 10/15/22  1427      History   Chief Complaint Chief Complaint  Patient presents with   Ear Fullness   Appointment    2:30    HPI Shirley Stephenson is a 42 y.o. female.   42 year old female pt, Shirley Stephenson, presents to urgent care for evaluation of left ear fullness x 4 days. Pt reports she has a chronic ear fullness for the last 3 to 4 years and it usually pops and the fullness is gone first thing in the morning this time that has not happened patient states she has used Debrox but without any improvement.  Patient denies any fever cold cough or additional symptoms.  Patient also requesting STI screening, no new partner , no symptoms  The history is provided by the patient. No language interpreter was used.    Past Medical History:  Diagnosis Date   Anxiety    ASCUS favor benign 04/2013   Ascus Pap smear negative high-risk HPV, negative ECC recommend followup Pap/HPV one year   CIN III with severe dysplasia    Epilepsy (HCC)    last seizure 02/2008   LGSIL (low grade squamous intraepithelial dysplasia) 10/2012   Cervical biopsy and ECC   Seizures (HCC)    Vaginal Pap smear, abnormal     Patient Active Problem List   Diagnosis Date Noted   Impacted cerumen of left ear 10/15/2022   Routine screening for STI (sexually transmitted infection) 10/15/2022   Normal labor and delivery 06/17/2014   Seizures (HCC) 08/27/2012   Cervical dysplasia 03/19/2012   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 Authorizing Provider  APRI 0.15-30  MG-MCG tablet Take 1 tablet by mouth daily. 07/19/16   [provider]  dicyclomine (BENTYL) 10 MG capsule Take 1 capsule (10 mg total) by mouth 4 (four) times daily -  before meals and at bedtime. 09/13/16   Doristine Bosworth, MD  ibuprofen (ADVIL,MOTRIN) 600 MG tablet Take 1 tablet (600 mg total) by mouth every 6 (six) hours. Patient not taking: Reported on 09/13/2016 06/19/14   Marlinda Mike, CNM  lamoTRIgine (LAMICTAL) 150 MG tablet Take 150 mg by mouth 2 (two) times daily.    [provider]  Prenatal Vit-Fe Fumarate-FA (PRENATAL MULTIVITAMIN) TABS tablet Take 3 tablets by mouth daily at 12 noon.    [provider]  ranitidine (ZANTAC) 150 MG tablet Take 150 mg by mouth 2 (two) times daily.    [provider]  UNABLE TO FIND Med Name: Canila (BCP)    [provider]    Family History Family History  Problem Relation Age of Onset   Heart disease Father    Cancer Father        Prostate, thyroid  Breast cancer Maternal Grandmother 51   COPD Paternal Grandfather    Asthma Brother     Social History Social History   Tobacco Use   Smoking status: Former    Current packs/day: 0.00    Types: Cigarettes    Quit date: 06/16/2010    Years since quitting: 12.3   Smokeless tobacco: Never  Vaping Use   Vaping status: Every Day  Substance Use Topics   Alcohol use: Yes    Comment: occasional   Drug use: No     Allergies   Imiquimod and Levetiracetam   Review of Systems Review of Systems  Constitutional:  Negative for fever.  HENT:  Positive for ear pain.        Ear fullness  Genitourinary:  Negative for dysuria and vaginal discharge.  All other systems reviewed and are negative.    Physical Exam Triage Vital Signs ED Triage Vitals  Encounter Vitals Group     BP 10/15/22 1500 124/79     Systolic BP Percentile --      Diastolic BP Percentile --      Pulse Rate 10/15/22 1500 91     Resp 10/15/22 1500 18     Temp 10/15/22 1500 98 F  (36.7 C)     Temp Source 10/15/22 1500 Oral     SpO2 10/15/22 1500 99 %     Weight --      Height --      Head Circumference --      Peak Flow --      Pain Score 10/15/22 1458 0     Pain Loc --      Pain Education --      Exclude from Growth Chart --    No data found.  Updated Vital Signs BP 124/79 (BP Location: Left Arm)   Pulse 91   Temp 98 F (36.7 C) (Oral)   Resp 18   SpO2 99%   Visual Acuity Right Eye Distance:   Left Eye Distance:   Bilateral Distance:    Right Eye Near:   Left Eye Near:    Bilateral Near:     Physical Exam Vitals and nursing note reviewed.  Constitutional:      General: She is not in acute distress.    Appearance: She is well-developed and well-groomed.  HENT:     Head: Normocephalic and atraumatic.     Right Ear: Ear canal and external ear normal. Tympanic membrane is retracted.     Left Ear: External ear normal. There is impacted cerumen.     Nose: Nose normal.     Mouth/Throat:     Lips: Pink.     Mouth: Mucous membranes are moist.     Pharynx: Oropharynx is clear.  Eyes:     General: Lids are normal.     Conjunctiva/sclera: Conjunctivae normal.     Pupils: Pupils are equal, round, and reactive to light.  Neck:     Trachea: Trachea normal.  Cardiovascular:     Rate and Rhythm: Normal rate and regular rhythm.     Pulses: Normal pulses.     Heart sounds: Normal heart sounds. No murmur heard. Pulmonary:     Effort: Pulmonary effort is normal. No respiratory distress.     Breath sounds: Normal breath sounds and air entry.  Abdominal:     Palpations: Abdomen is soft.     Tenderness: There is no abdominal tenderness.  Musculoskeletal:  General: No swelling.     Cervical back: Normal range of motion and neck supple.  Skin:    General: Skin is warm and dry.     Capillary Refill: Capillary refill takes less than 2 seconds.  Neurological:     General: No focal deficit present.     Mental Status: She is alert and oriented to  person, place, and time.     GCS: GCS eye subscore is 4. GCS verbal subscore is 5. GCS motor subscore is 6.  Psychiatric:        Attention and Perception: Attention normal.        Mood and Affect: Mood normal.        Speech: Speech normal.        Behavior: Behavior normal. Behavior is cooperative.      UC Treatments / Results  Labs (all labs ordered are listed, but only abnormal results are displayed) Labs Reviewed  CERVICOVAGINAL ANCILLARY ONLY    EKG   Radiology No results found.  Procedures Procedures (including critical care time)  Medications Ordered in UC Medications - No data to display  Initial Impression / Assessment and Plan / UC Course  I have reviewed the triage vital signs and the nursing notes.  Pertinent labs & imaging results that were available during my care of the patient were reviewed by me and considered in my medical decision making (see chart for details).  Clinical Course as of 10/15/22 1608  Sat Oct 15, 2022  1523 Ear irrigation ordered, left ear [JD]  1558 Left ear TM reassessed after irrigation,intact, pt states feels better. [JD]    Clinical Course User Index [JD] Kenney Going, Para March, NP   Discussed exam findings and plan of care with patient, strict go to ER precautions given.   Patient verbalized understanding to this provider.  Ddx: Cerumen impaction, STI screen,  Final Clinical Impressions(s) / UC Diagnoses   Final diagnoses:  Impacted cerumen of left ear  Routine screening for STI (sexually transmitted infection)     Discharge Instructions      May use over the counter flonase as directed for symptom management.  Check my chart for results Follow up with ENT if symptoms persist(ringing in ears, ear fullness/muffled,etc) Return as needed     ED Prescriptions   None    PDMP not reviewed this encounter.   Clancy Gourd, NP 10/15/22 [phone]

## 2022-10-17 LAB — CERVICOVAGINAL ANCILLARY ONLY
Bacterial Vaginitis (gardnerella): NEGATIVE
Candida Glabrata: NEGATIVE
Candida Vaginitis: NEGATIVE
Chlamydia: NEGATIVE
Comment: NEGATIVE
Comment: NEGATIVE
Comment: NEGATIVE
Comment: NEGATIVE
Comment: NEGATIVE
Comment: NORMAL
Neisseria Gonorrhea: NEGATIVE
Trichomonas: NEGATIVE

## 2022-12-05 DIAGNOSIS — F4323 Adjustment disorder with mixed anxiety and depressed mood: Secondary | ICD-10-CM | POA: Diagnosis not present

## 2022-12-26 DIAGNOSIS — F4323 Adjustment disorder with mixed anxiety and depressed mood: Secondary | ICD-10-CM | POA: Diagnosis not present

## 2023-01-09 DIAGNOSIS — F4323 Adjustment disorder with mixed anxiety and depressed mood: Secondary | ICD-10-CM | POA: Diagnosis not present

## 2023-01-16 DIAGNOSIS — Z118 Encounter for screening for other infectious and parasitic diseases: Secondary | ICD-10-CM | POA: Diagnosis not present

## 2023-01-16 DIAGNOSIS — Z1329 Encounter for screening for other suspected endocrine disorder: Secondary | ICD-10-CM | POA: Diagnosis not present

## 2023-01-16 DIAGNOSIS — Z131 Encounter for screening for diabetes mellitus: Secondary | ICD-10-CM | POA: Diagnosis not present

## 2023-01-16 DIAGNOSIS — Z13220 Encounter for screening for lipoid disorders: Secondary | ICD-10-CM | POA: Diagnosis not present

## 2023-01-16 DIAGNOSIS — Z113 Encounter for screening for infections with a predominantly sexual mode of transmission: Secondary | ICD-10-CM | POA: Diagnosis not present

## 2023-01-16 DIAGNOSIS — Z0000 Encounter for general adult medical examination without abnormal findings: Secondary | ICD-10-CM | POA: Diagnosis not present

## 2023-01-16 DIAGNOSIS — Z114 Encounter for screening for human immunodeficiency virus [HIV]: Secondary | ICD-10-CM | POA: Diagnosis not present

## 2023-01-16 DIAGNOSIS — Z1159 Encounter for screening for other viral diseases: Secondary | ICD-10-CM | POA: Diagnosis not present

## 2023-01-23 DIAGNOSIS — E875 Hyperkalemia: Secondary | ICD-10-CM | POA: Diagnosis not present

## 2023-01-23 DIAGNOSIS — F4323 Adjustment disorder with mixed anxiety and depressed mood: Secondary | ICD-10-CM | POA: Diagnosis not present

## 2023-02-13 DIAGNOSIS — F4323 Adjustment disorder with mixed anxiety and depressed mood: Secondary | ICD-10-CM | POA: Diagnosis not present

## 2023-02-20 DIAGNOSIS — F418 Other specified anxiety disorders: Secondary | ICD-10-CM | POA: Diagnosis not present

## 2023-03-06 DIAGNOSIS — R569 Unspecified convulsions: Secondary | ICD-10-CM | POA: Diagnosis not present

## 2023-03-13 DIAGNOSIS — F4323 Adjustment disorder with mixed anxiety and depressed mood: Secondary | ICD-10-CM | POA: Diagnosis not present

## 2023-04-17 DIAGNOSIS — F4323 Adjustment disorder with mixed anxiety and depressed mood: Secondary | ICD-10-CM | POA: Diagnosis not present

## 2023-05-08 DIAGNOSIS — F4323 Adjustment disorder with mixed anxiety and depressed mood: Secondary | ICD-10-CM | POA: Diagnosis not present

## 2023-05-15 DIAGNOSIS — F4323 Adjustment disorder with mixed anxiety and depressed mood: Secondary | ICD-10-CM | POA: Diagnosis not present

## 2023-06-05 DIAGNOSIS — F4323 Adjustment disorder with mixed anxiety and depressed mood: Secondary | ICD-10-CM | POA: Diagnosis not present

## 2023-06-26 DIAGNOSIS — F4323 Adjustment disorder with mixed anxiety and depressed mood: Secondary | ICD-10-CM | POA: Diagnosis not present

## 2023-07-17 DIAGNOSIS — F4323 Adjustment disorder with mixed anxiety and depressed mood: Secondary | ICD-10-CM | POA: Diagnosis not present

## 2023-08-07 DIAGNOSIS — F4323 Adjustment disorder with mixed anxiety and depressed mood: Secondary | ICD-10-CM | POA: Diagnosis not present

## 2023-09-04 DIAGNOSIS — Z113 Encounter for screening for infections with a predominantly sexual mode of transmission: Secondary | ICD-10-CM | POA: Diagnosis not present

## 2023-09-04 DIAGNOSIS — F4323 Adjustment disorder with mixed anxiety and depressed mood: Secondary | ICD-10-CM | POA: Diagnosis not present

## 2023-09-04 DIAGNOSIS — Z114 Encounter for screening for human immunodeficiency virus [HIV]: Secondary | ICD-10-CM | POA: Diagnosis not present

## 2023-09-04 DIAGNOSIS — Z118 Encounter for screening for other infectious and parasitic diseases: Secondary | ICD-10-CM | POA: Diagnosis not present

## 2023-09-04 DIAGNOSIS — Z1159 Encounter for screening for other viral diseases: Secondary | ICD-10-CM | POA: Diagnosis not present

## 2023-09-04 DIAGNOSIS — R635 Abnormal weight gain: Secondary | ICD-10-CM | POA: Diagnosis not present

## 2023-10-23 DIAGNOSIS — F4323 Adjustment disorder with mixed anxiety and depressed mood: Secondary | ICD-10-CM | POA: Diagnosis not present

## 2023-11-20 DIAGNOSIS — F4323 Adjustment disorder with mixed anxiety and depressed mood: Secondary | ICD-10-CM | POA: Diagnosis not present

## 2023-12-18 DIAGNOSIS — F4323 Adjustment disorder with mixed anxiety and depressed mood: Secondary | ICD-10-CM | POA: Diagnosis not present

## 2023-12-25 DIAGNOSIS — F4323 Adjustment disorder with mixed anxiety and depressed mood: Secondary | ICD-10-CM | POA: Diagnosis not present

## 2024-01-01 ENCOUNTER — Telehealth: Payer: Self-pay

## 2024-01-01 DIAGNOSIS — F4323 Adjustment disorder with mixed anxiety and depressed mood: Secondary | ICD-10-CM | POA: Diagnosis not present

## 2024-01-01 NOTE — Telephone Encounter (Signed)
 Copied from CRM #[id number]. Topic: Appointments - Scheduling Inquiry for Clinic >> Jan 01, 2024 10:31 AM Nessti S wrote: Reason for CRM: pt will like to establish care with Mahlon Comer [facility]. Pt is relative to alison and david hensley. Call back number [phone]

## 2024-01-08 DIAGNOSIS — F4323 Adjustment disorder with mixed anxiety and depressed mood: Secondary | ICD-10-CM | POA: Diagnosis not present

## 2024-01-08 NOTE — Telephone Encounter (Signed)
 Okay to establish?     Copied from CRM #[id number]. Topic: Appointments - Scheduling Inquiry for Clinic >> Jan 08, 2024  4:23 PM Shirley Stephenson wrote: Reason for CRM: Patient states she would like a call concerning wanting to schedule with Dr Mahlon, I provided the provider isn't taking new patients at this time.

## 2024-01-22 DIAGNOSIS — F4323 Adjustment disorder with mixed anxiety and depressed mood: Secondary | ICD-10-CM | POA: Diagnosis not present

## 2024-01-29 DIAGNOSIS — Z131 Encounter for screening for diabetes mellitus: Secondary | ICD-10-CM | POA: Diagnosis not present

## 2024-01-29 DIAGNOSIS — Z1231 Encounter for screening mammogram for malignant neoplasm of breast: Secondary | ICD-10-CM | POA: Diagnosis not present

## 2024-01-29 DIAGNOSIS — Z1329 Encounter for screening for other suspected endocrine disorder: Secondary | ICD-10-CM | POA: Diagnosis not present

## 2024-01-29 DIAGNOSIS — Z1331 Encounter for screening for depression: Secondary | ICD-10-CM | POA: Diagnosis not present

## 2024-01-29 DIAGNOSIS — Z13220 Encounter for screening for lipoid disorders: Secondary | ICD-10-CM | POA: Diagnosis not present

## 2024-01-29 DIAGNOSIS — Z0000 Encounter for general adult medical examination without abnormal findings: Secondary | ICD-10-CM | POA: Diagnosis not present

## 2024-01-29 DIAGNOSIS — Z01419 Encounter for gynecological examination (general) (routine) without abnormal findings: Secondary | ICD-10-CM | POA: Diagnosis not present
